# Patient Record
Sex: Male | Born: 1955 | Race: White | Hispanic: No | Smoking: Current every day smoker
Health system: Southern US, Community
[De-identification: ages and names within clinical notes are randomized; demographics above are authoritative.]

## PROBLEM LIST (undated history)

## (undated) DIAGNOSIS — R569 Unspecified convulsions: Secondary | ICD-10-CM

## (undated) DIAGNOSIS — E785 Hyperlipidemia, unspecified: Secondary | ICD-10-CM

## (undated) DIAGNOSIS — F1010 Alcohol abuse, uncomplicated: Secondary | ICD-10-CM

## (undated) DIAGNOSIS — T7840XA Allergy, unspecified, initial encounter: Secondary | ICD-10-CM

## (undated) DIAGNOSIS — M1990 Unspecified osteoarthritis, unspecified site: Secondary | ICD-10-CM

## (undated) HISTORY — DX: Hyperlipidemia, unspecified: E78.5

## (undated) HISTORY — DX: Allergy, unspecified, initial encounter: T78.40XA

## (undated) HISTORY — PX: JOINT REPLACEMENT: SHX530

## (undated) HISTORY — DX: Unspecified osteoarthritis, unspecified site: M19.90

## (undated) HISTORY — PX: OTHER SURGICAL HISTORY: SHX169

## (undated) HISTORY — PX: SMALL INTESTINE SURGERY: SHX150

---

## 2004-11-12 ENCOUNTER — Inpatient Hospital Stay: Payer: Self-pay | Admitting: Unknown Physician Specialty

## 2007-05-16 ENCOUNTER — Ambulatory Visit: Payer: Self-pay

## 2007-06-12 ENCOUNTER — Inpatient Hospital Stay: Payer: Self-pay | Admitting: Internal Medicine

## 2007-06-15 ENCOUNTER — Ambulatory Visit: Payer: Self-pay | Admitting: Oncology

## 2007-06-25 ENCOUNTER — Inpatient Hospital Stay: Payer: Self-pay | Admitting: Internal Medicine

## 2007-06-25 ENCOUNTER — Other Ambulatory Visit: Payer: Self-pay

## 2007-07-16 ENCOUNTER — Ambulatory Visit: Payer: Self-pay | Admitting: Oncology

## 2009-04-13 IMAGING — CR DG CHEST 1V PORT
1 series · 1 of 1 positions shown · non-contrast
Comparison: none

REASON FOR EXAM: fever, cirrhosis r/o infiltrate
COMMENTS:

PROCEDURE:     DXR - DXR PORTABLE CHEST SINGLE VIEW  - June 13, 2007  [DATE]
RESULT:     No acute cardiopulmonary disease is identified.

[view not recorded]
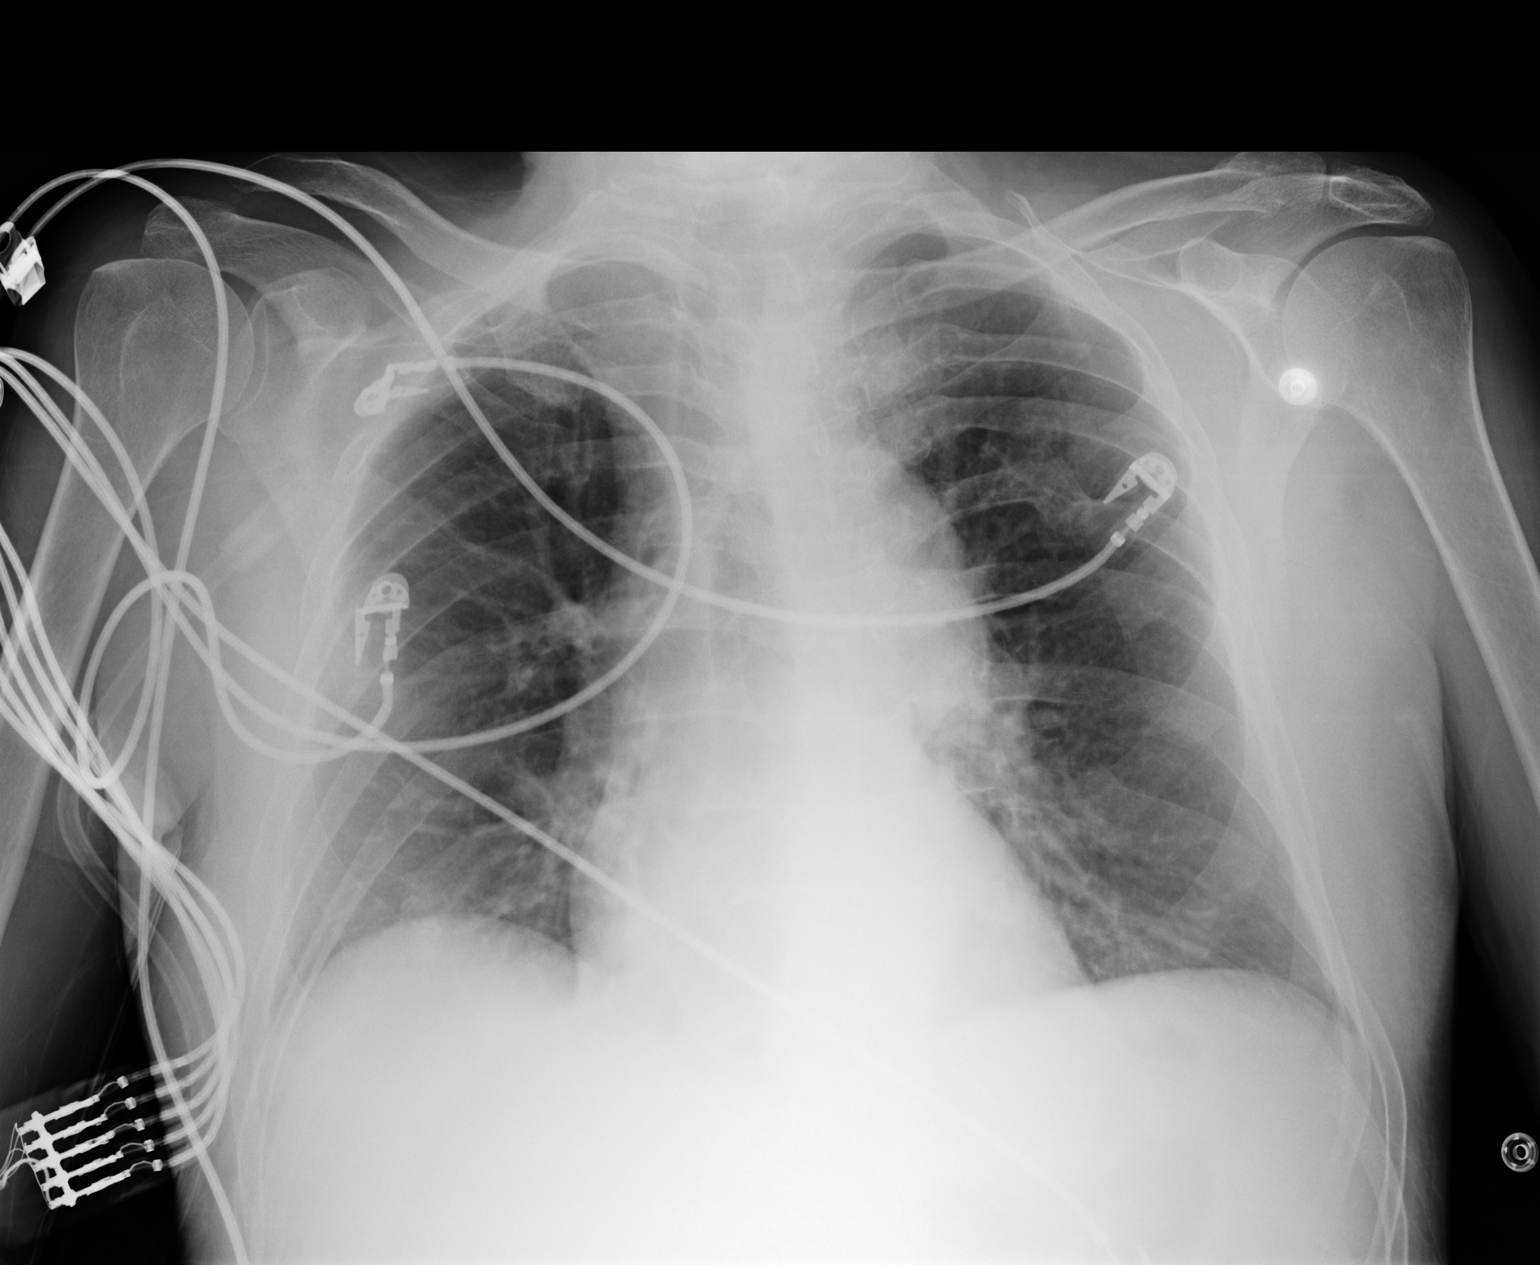

[1 of 1 positions shown; findings below may reference images not displayed]

IMPRESSION: No acute abnormality.

## 2010-04-16 ENCOUNTER — Inpatient Hospital Stay: Payer: Self-pay | Admitting: Surgery

## 2010-05-16 ENCOUNTER — Emergency Department: Payer: Self-pay | Admitting: Emergency Medicine

## 2011-12-23 LAB — COMPREHENSIVE METABOLIC PANEL
Albumin: 3.8 g/dL (ref 3.4–5.0)
Anion Gap: 13 (ref 7–16)
BUN: 6 mg/dL — ABNORMAL LOW (ref 7–18)
Bilirubin,Total: 0.6 mg/dL (ref 0.2–1.0)
Calcium, Total: 9.5 mg/dL (ref 8.5–10.1)
Glucose: 121 mg/dL — ABNORMAL HIGH (ref 65–99)
Osmolality: 278 (ref 275–301)
Potassium: 3.3 mmol/L — ABNORMAL LOW (ref 3.5–5.1)
SGOT(AST): 21 U/L (ref 15–37)
Sodium: 140 mmol/L (ref 136–145)

## 2011-12-23 LAB — URINALYSIS, COMPLETE
Bilirubin,UR: NEGATIVE
Cellular Cast: 4
Granular Cast: 6
Ketone: NEGATIVE
Protein: 100
RBC,UR: 82 /HPF (ref 0–5)
Squamous Epithelial: 1
WBC UR: 15 /HPF (ref 0–5)

## 2011-12-23 LAB — CBC WITH DIFFERENTIAL/PLATELET
Basophil #: 0 10*3/uL (ref 0.0–0.1)
Eosinophil #: 0.1 10*3/uL (ref 0.0–0.7)
MCH: 29.1 pg (ref 26.0–34.0)
MCHC: 32.1 g/dL (ref 32.0–36.0)
MCV: 91 fL (ref 80–100)
Monocyte #: 0.8 x10 3/mm (ref 0.2–1.0)
Monocyte %: 6 %
RBC: 4.44 10*6/uL (ref 4.40–5.90)
RDW: 13.9 % (ref 11.5–14.5)
WBC: 12.7 10*3/uL — ABNORMAL HIGH (ref 3.8–10.6)

## 2011-12-23 LAB — DRUG SCREEN, URINE
Barbiturates, Ur Screen: NEGATIVE (ref ?–200)
Opiate, Ur Screen: POSITIVE (ref ?–300)
Phencyclidine (PCP) Ur S: NEGATIVE (ref ?–25)
Tricyclic, Ur Screen: NEGATIVE (ref ?–1000)

## 2011-12-23 LAB — TROPONIN I: Troponin-I: <0.02 ng/mL

## 2011-12-24 ENCOUNTER — Inpatient Hospital Stay: Payer: Self-pay | Admitting: Internal Medicine

## 2011-12-24 LAB — ETHANOL
Ethanol %: <0.003 % (ref 0.000–0.080)
Ethanol: <3 mg/dL

## 2011-12-24 LAB — ACETAMINOPHEN LEVEL: Acetaminophen: <2 ug/mL

## 2011-12-24 LAB — T4, FREE: Free Thyroxine: 1.6 ng/dL — ABNORMAL HIGH (ref 0.76–1.46)

## 2011-12-24 LAB — CK: CK, Total: 88 U/L (ref 35–232)

## 2011-12-25 LAB — COMPREHENSIVE METABOLIC PANEL
Albumin: 2.9 g/dL — ABNORMAL LOW (ref 3.4–5.0)
Anion Gap: 12 (ref 7–16)
BUN: 5 mg/dL — ABNORMAL LOW (ref 7–18)
Bilirubin,Total: 0.7 mg/dL (ref 0.2–1.0)
Calcium, Total: 8 mg/dL — ABNORMAL LOW (ref 8.5–10.1)
Chloride: 105 mmol/L (ref 98–107)
Co2: 23 mmol/L (ref 21–32)
Creatinine: 0.96 mg/dL (ref 0.60–1.30)
EGFR (African American): >60
EGFR (Non-African Amer.): >60
Osmolality: 275 (ref 275–301)
Potassium: 3.8 mmol/L (ref 3.5–5.1)
SGOT(AST): 22 U/L (ref 15–37)
SGPT (ALT): 10 U/L — ABNORMAL LOW
Sodium: 140 mmol/L (ref 136–145)
Total Protein: 6 g/dL — ABNORMAL LOW (ref 6.4–8.2)

## 2011-12-25 LAB — MAGNESIUM: Magnesium: 1.4 mg/dL — ABNORMAL LOW

## 2011-12-25 LAB — CBC WITH DIFFERENTIAL/PLATELET
Basophil %: 0.3 %
Eosinophil #: 0.1 10*3/uL (ref 0.0–0.7)
Eosinophil %: 0.6 %
HCT: 33.3 % — ABNORMAL LOW (ref 40.0–52.0)
Lymphocyte %: 12.7 %
MCH: 29.8 pg (ref 26.0–34.0)
MCHC: 32.8 g/dL (ref 32.0–36.0)
MCV: 91 fL (ref 80–100)
Monocyte #: 0.5 x10 3/mm (ref 0.2–1.0)
Monocyte %: 6.7 %
Neutrophil %: 79.7 %
Platelet: 100 10*3/uL — ABNORMAL LOW (ref 150–440)
RBC: 3.67 10*6/uL — ABNORMAL LOW (ref 4.40–5.90)
RDW: 13.9 % (ref 11.5–14.5)
WBC: 7.9 10*3/uL (ref 3.8–10.6)

## 2011-12-29 LAB — CULTURE, BLOOD (SINGLE)

## 2012-01-23 DIAGNOSIS — S72009A Fracture of unspecified part of neck of unspecified femur, initial encounter for closed fracture: Secondary | ICD-10-CM | POA: Insufficient documentation

## 2012-01-23 DIAGNOSIS — F1011 Alcohol abuse, in remission: Secondary | ICD-10-CM | POA: Insufficient documentation

## 2012-03-18 DIAGNOSIS — G894 Chronic pain syndrome: Secondary | ICD-10-CM | POA: Insufficient documentation

## 2012-03-18 DIAGNOSIS — M1990 Unspecified osteoarthritis, unspecified site: Secondary | ICD-10-CM | POA: Insufficient documentation

## 2012-05-29 ENCOUNTER — Emergency Department: Payer: Self-pay | Admitting: Emergency Medicine

## 2013-08-18 DIAGNOSIS — N200 Calculus of kidney: Secondary | ICD-10-CM | POA: Insufficient documentation

## 2013-11-18 DIAGNOSIS — F32A Depression, unspecified: Secondary | ICD-10-CM | POA: Insufficient documentation

## 2013-11-18 DIAGNOSIS — F329 Major depressive disorder, single episode, unspecified: Secondary | ICD-10-CM | POA: Insufficient documentation
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
## 2014-11-06 NOTE — Consult Note (Signed)
Brief Consult Note: Diagnosis: delirium due to accidental medication overdose now resolved.   Patient was seen by consultant.   Consult note dictated.   Comments: Psychiatry: Patient seen. Chart reviewed. 59 year old man with a past history of alcohol abuse and opiate and benzo abuse who was brought in unconcious after a fall at home. Patient is now alert, oriented and normally interactive. No sign of delirium. He admits he took an unknown "muscle relaxer" from somebody but denies having overdosed on his pain meds. Denies any suicidal ideation at all or any suicidal intent. He can't explain why his oxycodone bottle is empty. He is clearly fixed on wanting to be "allowed" to be discharged and on convining me he is not suicidal even before I had brought it up. If he is abusing meds he has no insight he can express about it and no interest in discussing it as a problem. I do not think he is an acute suicide risk. I do think anyone who prescribes narcotics for him should do so very carefully. Education done about dangers of opiate abuse and mixing opiates and benzos. No further treatment at this time.  Electronic Signatures: Audery Amellapacs, John T (MD)  (Signed 13-Jun-13 13:00)  Authored: Brief Consult Note   Last Updated: 13-Jun-13 13:00 by Audery Amellapacs, John T (MD)

## 2014-12-27 ENCOUNTER — Emergency Department: Payer: Medicare Other

## 2014-12-27 ENCOUNTER — Encounter: Payer: Self-pay | Admitting: Emergency Medicine

## 2014-12-27 ENCOUNTER — Emergency Department
Admission: EM | Admit: 2014-12-27 | Discharge: 2014-12-27 | Disposition: A | Discharge status: Home / Self Care | Payer: Medicare Other | Attending: Emergency Medicine | Admitting: Emergency Medicine

## 2014-12-27 DIAGNOSIS — M25522 Pain in left elbow: Secondary | ICD-10-CM | POA: Diagnosis present

## 2014-12-27 DIAGNOSIS — M7022 Olecranon bursitis, left elbow: Secondary | ICD-10-CM | POA: Diagnosis not present

## 2014-12-27 DIAGNOSIS — Z720 Tobacco use: Secondary | ICD-10-CM | POA: Insufficient documentation

## 2014-12-27 DIAGNOSIS — Y9389 Activity, other specified: Secondary | ICD-10-CM | POA: Insufficient documentation

## 2014-12-27 MED ORDER — IBUPROFEN 800 MG PO TABS: 800.0000 mg | ORAL_TABLET | Freq: Three times a day (TID) | ORAL | Status: DC | PRN

## 2014-12-27 MED ORDER — OXYCODONE HCL 10 MG PO TABS: 10.0000 mg | ORAL_TABLET | Freq: Four times a day (QID) | ORAL | Status: DC | PRN

## 2014-12-27 MED ORDER — PREDNISONE 10 MG PO TABS: ORAL_TABLET | ORAL | Status: DC
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
## 2014-12-27 NOTE — Discharge Instructions (Signed)
Bursitis °Bursitis is a swelling and soreness (inflammation) of a fluid-filled sac (bursa) that overlies and protects a joint. It can be caused by injury, overuse of the joint, arthritis or infection. The joints most likely to be affected are the elbows, shoulders, hips and knees. °HOME CARE INSTRUCTIONS  °· Apply ice to the affected area for 15-20 minutes each hour while awake for 2 days. Put the ice in a plastic bag and place a towel between the bag of ice and your skin. °· Rest the injured joint as much as possible, but continue to put the joint through a full range of motion, 4 times per day. (The shoulder joint especially becomes rapidly "frozen" if not used.) When the pain lessens, begin normal slow movements and usual activities. °· Only take over-the-counter or prescription medicines for pain, discomfort or fever as directed by your caregiver. °· Your caregiver may recommend draining the bursa and injecting medicine into the bursa. This may help the healing process. °· Follow all instructions for follow-up with your caregiver. This includes any orthopedic referrals, physical therapy and rehabilitation. Any delay in obtaining necessary care could result in a delay or failure of the bursitis to heal and chronic pain. °SEEK IMMEDIATE MEDICAL CARE IF:  °· Your pain increases even during treatment. °· You develop an oral temperature above 102° F (38.9° C) and have heat and inflammation over the involved bursa. °MAKE SURE YOU:  °· Understand these instructions. °· Will watch your condition. °· Will get help right away if you are not doing well or get worse. °Document Released: 06/28/2000 Document Revised: 09/23/2011 Document Reviewed: 09/20/2013 °[employer]® Patient Information ©2015 [employer]. This information is not intended to replace advice given to you by your health care provider. Make sure you discuss any questions you have with your health care provider. ° °
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
## 2015-01-26 DIAGNOSIS — F5101 Primary insomnia: Secondary | ICD-10-CM | POA: Insufficient documentation

## 2015-08-25 ENCOUNTER — Emergency Department
Admission: EM | Admit: 2015-08-25 | Discharge: 2015-08-25 | Disposition: A | Discharge status: Home / Self Care | Payer: Medicare Other | Attending: Emergency Medicine | Admitting: Emergency Medicine

## 2015-08-25 ENCOUNTER — Emergency Department: Payer: Medicare Other

## 2015-08-25 ENCOUNTER — Encounter: Payer: Self-pay | Admitting: Emergency Medicine

## 2015-08-25 DIAGNOSIS — Y9289 Other specified places as the place of occurrence of the external cause: Secondary | ICD-10-CM | POA: Insufficient documentation

## 2015-08-25 DIAGNOSIS — S2231XA Fracture of one rib, right side, initial encounter for closed fracture: Secondary | ICD-10-CM

## 2015-08-25 DIAGNOSIS — F17200 Nicotine dependence, unspecified, uncomplicated: Secondary | ICD-10-CM | POA: Insufficient documentation

## 2015-08-25 DIAGNOSIS — Y998 Other external cause status: Secondary | ICD-10-CM | POA: Insufficient documentation

## 2015-08-25 DIAGNOSIS — S29001A Unspecified injury of muscle and tendon of front wall of thorax, initial encounter: Secondary | ICD-10-CM | POA: Diagnosis present

## 2015-08-25 DIAGNOSIS — S2241XA Multiple fractures of ribs, right side, initial encounter for closed fracture: Secondary | ICD-10-CM | POA: Diagnosis not present

## 2015-08-25 DIAGNOSIS — Y9389 Activity, other specified: Secondary | ICD-10-CM | POA: Diagnosis not present

## 2015-08-25 DIAGNOSIS — Z880 Allergy status to penicillin: Secondary | ICD-10-CM | POA: Insufficient documentation

## 2015-08-25 DIAGNOSIS — W108XXA Fall (on) (from) other stairs and steps, initial encounter: Secondary | ICD-10-CM | POA: Diagnosis not present

## 2015-08-25 DIAGNOSIS — S4991XA Unspecified injury of right shoulder and upper arm, initial encounter: Secondary | ICD-10-CM | POA: Diagnosis not present

## 2015-08-25 MED ORDER — OXYCODONE-ACETAMINOPHEN 5-325 MG PO TABS: 1.0000 | ORAL_TABLET | Freq: Four times a day (QID) | ORAL | Status: DC | PRN

## 2015-08-25 MED ORDER — NAPROXEN 500 MG PO TABS: 500.0000 mg | ORAL_TABLET | Freq: Two times a day (BID) | ORAL | Status: DC

## 2015-08-25 NOTE — ED Provider Notes (Signed)
[facility] Emergency Department Provider Note  ____________________________________________  Time seen: Approximately 1:28 PM  I have reviewed the triage vital signs and the nursing notes.   HISTORY  Chief Complaint Fall    HPI Patrick Patton is a 60 y.o. male who presents to emergency department status post fall 3 days prior. Patient states that he is walking down a flight of stairs when he had a mechanical fall landing on his right shoulder, arm, ribs. Patient states that he initially did not want to come to the hospital thinking "I just bruised myself." Patient states thatthe pain in his shoulder and ribs has not improved. Patient is concerned that "a maintenance and damaged in my ribs." Patient denies any shortness of breath. He denies hitting his head or losing consciousness at the time of incident.   History reviewed. No pertinent past medical history.  There are no active problems to display for this patient.   Past Surgical History  Procedure Laterality Date  . Pin in arm      Current Outpatient Rx  Name  Route  Sig  Dispense  Refill  . ibuprofen (ADVIL,MOTRIN) 800 MG tablet   Oral   Take 1 tablet (800 mg total) by mouth every 8 (eight) hours as needed.   30 tablet   0   . naproxen (NAPROSYN) 500 MG tablet   Oral   Take 1 tablet (500 mg total) by mouth 2 (two) times daily with a meal.   60 tablet   0   . Oxycodone HCl 10 MG TABS   Oral   Take 1 tablet (10 mg total) by mouth 4 (four) times daily as needed.   12 tablet   0   . oxyCODONE-acetaminophen (ROXICET) 5-325 MG tablet   Oral   Take 1 tablet by mouth every 6 (six) hours as needed for severe pain.   20 tablet   0   . predniSONE (DELTASONE) 10 MG tablet      Take 5 tablets daily for 5 days   25 tablet   0     Allergies Penicillins and Codeine  History reviewed. No pertinent family history.  Social History Social History  Substance Use Topics  . Smoking  status: Current Every Day Smoker  . Smokeless tobacco: None  . Alcohol Use: No     Review of Systems  Constitutional: No fever/chills Cardiovascular: no chest pain. Respiratory: no cough. No SOB. Gastrointestinal: No abdominal pain.  No nausea, no vomiting.   Musculoskeletal: Negative for back pain. Patient reports pain to the right shoulder and pain to the right rib cage. Skin: Negative for rash. Neurological: Negative for headaches, focal weakness or numbness. 10-point ROS otherwise negative.  ____________________________________________   PHYSICAL EXAM:  VITAL SIGNS: ED Triage Vitals  Enc Vitals Group     BP 08/25/15 1149 116/67 mmHg     Pulse Rate 08/25/15 1149 65     Resp 08/25/15 1149 18     Temp 08/25/15 1149 98.6 F (37 C)     Temp Source 08/25/15 1149 Oral     SpO2 08/25/15 1149 96 %     Weight 08/25/15 1149 125 lb (56.7 kg)     Height 08/25/15 1149  (1.676 m)     Head Cir --      Peak Flow --      Pain Score 08/25/15 1145 7     Pain Loc --      Pain Edu? --  Excl. in GC? --      Constitutional: Alert and oriented. Well appearing and in no acute distress. Eyes: Conjunctivae are normal. PERRL. EOMI. Head: Atraumatic. Neck: No stridor.  No cervical spine tenderness to palpation. Cardiovascular: Normal rate, regular rhythm. Normal S1 and S2.  Good peripheral circulation. Respiratory: Normal respiratory effort without tachypnea or retractions. Lungs CTAB. No absent or decreased breath sounds. Good air entry into the bases. Gastrointestinal: Soft and nontender. No distention. No CVA tenderness. Musculoskeletal: No visible abnormality to ribs upon inspection. No flow segments or paradoxical chest wall movement. Patient is tender to palpation over the third, fourth, fifth, sixth ribs in the lateral rib cage. No palpable abnormality. Neurologic:  Normal speech and language. No gross focal neurologic deficits are appreciated.  Skin:  Skin is warm, dry and  intact. No rash noted. Psychiatric: Mood and affect are normal. Speech and behavior are normal. Patient exhibits appropriate insight and judgement.   ____________________________________________   LABS (all labs ordered are listed, but only abnormal results are displayed)  Labs Reviewed - No data to display ____________________________________________  EKG   ____________________________________________  RADIOLOGY Festus Barren Cuthriell, personally viewed and evaluated these images (plain radiographs) as part of my medical decision making, as well as reviewing the written report by the radiologist.  Dg Ribs Unilateral W/chest Right  08/25/2015  CLINICAL DATA:  Status post fall 3 days ago. Right chest pain. Initial encounter. EXAM: RIGHT RIBS AND CHEST - 3+ VIEW COMPARISON:  Single view of the chest 12/24/2011. FINDINGS: The chest is hyperexpanded with attenuation of the pulmonary vasculature. No focal airspace disease is identified. There is no pneumothorax or pleural effusion. Acute right sixth and seventh rib fractures are identified. No other acute fracture is seen. There is a remote left 6 rib fracture, unchanged. IMPRESSION: Acute right sixth and seventh rib fractures. Negative for pneumothorax. Emphysema. Electronically Signed   By: Drusilla Kanner M.D.   On: 08/25/2015 14:02   Dg Shoulder Right  08/25/2015  CLINICAL DATA:  Pain following fall down steps 3 days prior EXAM: RIGHT SHOULDER - 2+ VIEW COMPARISON:  None. FINDINGS: Oblique, axillary, and Y scapular images were obtained. There appears to be a degree of acromioclavicular separation. There is no frank dislocation. No shoulder region fracture. No erosive change. In the chest region, there are mildly displaced fractures of the posterior right fifth and sixth ribs. No pneumothorax is evident in the visualized portions of the right hemithorax. IMPRESSION: Fractures of the posterior right fifth and sixth ribs. Advise chest  radiograph to further assess. No shoulder region fractures apparent. There is suspected right acromioclavicular separation. No frank dislocation evident. Electronically Signed   By: Bretta Bang III M.D.   On: 08/25/2015 13:08    ____________________________________________    PROCEDURES  Procedure(s) performed:       Medications - No data to display   ____________________________________________   INITIAL IMPRESSION / ASSESSMENT AND PLAN / ED COURSE  Pertinent labs & imaging results that were available during my care of the patient were reviewed by me and considered in my medical decision making (see chart for details).  Patient's diagnosis is consistent with right fifth and sixth rib fractures. There is no underlying pneumothorax. Exam is reassuring.. Patient will be discharged home with prescriptions for pain medication. He is to follow-up with orthopedic surgeon for further evaluation and treatment as needed.. Patient is given ED precautions to return to the ED for any worsening or new symptoms.  ____________________________________________  FINAL CLINICAL IMPRESSION(S) / ED DIAGNOSES  Final diagnoses:  Right rib fracture, closed, initial encounter      NEW MEDICATIONS STARTED DURING THIS VISIT:  New Prescriptions   NAPROXEN (NAPROSYN) 500 MG TABLET    Take 1 tablet (500 mg total) by mouth 2 (two) times daily with a meal.   OXYCODONE-ACETAMINOPHEN (ROXICET) 5-325 MG TABLET    Take 1 tablet by mouth every 6 (six) hours as needed for severe pain.        Delorise Royals Cuthriell, PA-C 08/25/15 1412  Sharyn Creamer, MD 08/25/15 [phone]

## 2015-08-25 NOTE — Discharge Instructions (Signed)
Rib Fracture °A rib fracture is a break or crack in one of the bones of the ribs. The ribs are a group of long, curved bones that wrap around your chest and attach to your spine. They protect your lungs and other organs in the chest cavity. A broken or cracked rib is often painful, but most do not cause other problems. Most rib fractures heal on their own over time. However, rib fractures can be more serious if multiple ribs are broken or if broken ribs move out of place and push against other structures. °CAUSES  °· A direct blow to the chest. For example, this could happen during contact sports, a car accident, or a fall against a hard object. °· Repetitive movements with high force, such as pitching a baseball or having severe coughing spells. °SYMPTOMS  °· Pain when you breathe in or cough. °· Pain when someone presses on the injured area. °DIAGNOSIS  °Your caregiver will perform a physical exam. Various imaging tests may be ordered to confirm the diagnosis and to look for related injuries. These tests may include a chest X-ray, computed tomography (CT), magnetic resonance imaging (MRI), or a bone scan. °TREATMENT  °Rib fractures usually heal on their own in 1-3 months. The longer healing period is often associated with a continued cough or other aggravating activities. During the healing period, pain control is very important. Medication is usually given to control pain. Hospitalization or surgery may be needed for more severe injuries, such as those in which multiple ribs are broken or the ribs have moved out of place.  °HOME CARE INSTRUCTIONS  °· Avoid strenuous activity and any activities or movements that cause pain. Be careful during activities and avoid bumping the injured rib. °· Gradually increase activity as directed by your caregiver. °· Only take over-the-counter or prescription medications as directed by your caregiver. Do not take other medications without asking your caregiver first. °· Apply ice  to the injured area for the first 1-2 days after you have been treated or as directed by your caregiver. Applying ice helps to reduce inflammation and pain. °¨ Put ice in a plastic bag. °¨ Place a towel between your skin and the bag.   °¨ Leave the ice on for 15-20 minutes at a time, every 2 hours while you are awake. °· Perform deep breathing as directed by your caregiver. This will help prevent pneumonia, which is a common complication of a broken rib. Your caregiver may instruct you to: °¨ Take deep breaths several times a day. °¨ Try to cough several times a day, holding a pillow against the injured area. °¨ Use a device called an incentive spirometer to practice deep breathing several times a day. °· Drink enough fluids to keep your urine clear or pale yellow. This will help you avoid constipation.   °· Do not wear a rib belt or binder. These restrict breathing, which can lead to pneumonia.   °SEEK IMMEDIATE MEDICAL CARE IF:  °· You have a fever.   °· You have difficulty breathing or shortness of breath.   °· You develop a continual cough, or you cough up thick or bloody sputum. °· You feel sick to your stomach (nausea), throw up (vomit), or have abdominal pain.   °· You have worsening pain not controlled with medications.   °MAKE SURE YOU: °· Understand these instructions. °· Will watch your condition. °· Will get help right away if you are not doing well or get worse. °  °This information is not intended to   replace advice given to you by your health care provider. Make sure you discuss any questions you have with your health care provider. °  °Document Released: 07/01/2005 Document Revised: 03/03/2013 Document Reviewed: 09/02/2012 °Elsevier Interactive Patient Education ©2016 [employer] ° °Blunt Chest Trauma °Blunt chest trauma is an injury caused by a blow to the chest. These chest injuries can be very painful. Blunt chest trauma often results in bruised or broken (fractured) ribs. Most cases of bruised  and fractured ribs from blunt chest traumas get better after 1 to 3 weeks of rest and pain medicine. Often, the soft tissue in the chest wall is also injured, causing pain and bruising. Internal organs, such as the heart and lungs, may also be injured. Blunt chest trauma can lead to serious medical problems. This injury requires immediate medical care. °CAUSES  °· Motor vehicle collisions. °· Falls. °· Physical violence. °· Sports injuries. °SYMPTOMS  °· Chest pain. The pain may be worse when you move or breathe deeply. °· Shortness of breath. °· Lightheadedness. °· Bruising. °· Tenderness. °· Swelling. °DIAGNOSIS  °Your caregiver will do a physical exam. X-rays may be taken to look for fractures. However, minor rib fractures may not show up on X-rays until a few days after the injury. If a more serious injury is suspected, further imaging tests may be done. This may include ultrasounds, computed tomography (CT) scans, or magnetic resonance imaging (MRI). °TREATMENT  °Treatment depends on the severity of your injury. Your caregiver may prescribe pain medicines and deep breathing exercises. °HOME CARE INSTRUCTIONS °· Limit your activities until you can move around without much pain. °· Do not do any strenuous work until your injury is healed. °· Put ice on the injured area. °¨ Put ice in a plastic bag. °¨ Place a towel between your skin and the bag. °¨ Leave the ice on for 15-20 minutes, 03-04 times a day. °· You may wear a rib belt as directed by your caregiver to reduce pain. °· Practice deep breathing as directed by your caregiver to keep your lungs clear. °· Only take over-the-counter or prescription medicines for pain, fever, or discomfort as directed by your caregiver. °SEEK IMMEDIATE MEDICAL CARE IF:  °· You have increasing pain or shortness of breath. °· You cough up blood. °· You have nausea, vomiting, or abdominal pain. °· You have a fever. °· You feel dizzy, weak, or you faint. °MAKE SURE  YOU: °· Understand these instructions. °· Will watch your condition. °· Will get help right away if you are not doing well or get worse. °  °This information is not intended to replace advice given to you by your health care provider. Make sure you discuss any questions you have with your health care provider. °  °Document Released: 08/08/2004 Document Revised: 07/22/2014 Document Reviewed: 12/28/2014 °Elsevier Interactive Patient Education ©2016 [employer] ° °

## 2015-08-25 NOTE — ED Notes (Signed)
Reports fell 3 days ago, pain to right shoulder and elbow.  +PMS to arm

## 2015-09-26 DIAGNOSIS — N183 Chronic kidney disease, stage 3 unspecified: Secondary | ICD-10-CM | POA: Insufficient documentation

## 2015-09-26 DIAGNOSIS — F1910 Other psychoactive substance abuse, uncomplicated: Secondary | ICD-10-CM | POA: Insufficient documentation

## 2015-09-26 DIAGNOSIS — S22009A Unspecified fracture of unspecified thoracic vertebra, initial encounter for closed fracture: Secondary | ICD-10-CM | POA: Insufficient documentation

## 2015-10-02 DIAGNOSIS — F17200 Nicotine dependence, unspecified, uncomplicated: Secondary | ICD-10-CM | POA: Insufficient documentation

## 2015-10-09 ENCOUNTER — Ambulatory Visit: Payer: Medicare Other | Admitting: Pain Medicine

## 2016-02-29 DIAGNOSIS — F419 Anxiety disorder, unspecified: Secondary | ICD-10-CM | POA: Insufficient documentation

## 2016-05-03 ENCOUNTER — Emergency Department
Admission: EM | Admit: 2016-05-03 | Discharge: 2016-05-03 | Disposition: A | Discharge status: Home / Self Care | Payer: Medicare Other | Attending: Student in an Organized Health Care Education/Training Program | Admitting: Student in an Organized Health Care Education/Training Program

## 2016-05-03 ENCOUNTER — Encounter: Payer: Self-pay | Admitting: Intensive Care

## 2016-05-03 ENCOUNTER — Emergency Department: Payer: Medicare Other

## 2016-05-03 DIAGNOSIS — G8929 Other chronic pain: Secondary | ICD-10-CM | POA: Insufficient documentation

## 2016-05-03 DIAGNOSIS — R569 Unspecified convulsions: Secondary | ICD-10-CM

## 2016-05-03 DIAGNOSIS — G40909 Epilepsy, unspecified, not intractable, without status epilepticus: Secondary | ICD-10-CM | POA: Insufficient documentation

## 2016-05-03 DIAGNOSIS — F17210 Nicotine dependence, cigarettes, uncomplicated: Secondary | ICD-10-CM | POA: Insufficient documentation

## 2016-05-03 DIAGNOSIS — E860 Dehydration: Secondary | ICD-10-CM | POA: Diagnosis not present

## 2016-05-03 DIAGNOSIS — Z79899 Other long term (current) drug therapy: Secondary | ICD-10-CM | POA: Insufficient documentation

## 2016-05-03 DIAGNOSIS — M25551 Pain in right hip: Secondary | ICD-10-CM | POA: Insufficient documentation

## 2016-05-03 HISTORY — DX: Unspecified convulsions: R56.9

## 2016-05-03 LAB — CBC WITH DIFFERENTIAL/PLATELET
BASOS ABS: 0.1 10*3/uL (ref 0–0.1)
Basophils Relative: 1 %
EOS PCT: 1 %
Eosinophils Absolute: 0.1 10*3/uL (ref 0–0.7)
HCT: 35.5 % — ABNORMAL LOW (ref 40.0–52.0)
Hemoglobin: 12.1 g/dL — ABNORMAL LOW (ref 13.0–18.0)
LYMPHS PCT: 14 %
Lymphs Abs: 0.9 10*3/uL — ABNORMAL LOW (ref 1.0–3.6)
MCH: 29 pg (ref 26.0–34.0)
MCHC: 33.9 g/dL (ref 32.0–36.0)
MCV: 85.6 fL (ref 80.0–100.0)
MONO ABS: 0.6 10*3/uL (ref 0.2–1.0)
MONOS PCT: 9 %
Neutro Abs: 5.1 10*3/uL (ref 1.4–6.5)
Neutrophils Relative %: 75 %
PLATELETS: 208 10*3/uL (ref 150–440)
RBC: 4.15 MIL/uL — ABNORMAL LOW (ref 4.40–5.90)
RDW: 15 % — AB (ref 11.5–14.5)
WBC: 6.8 10*3/uL (ref 3.8–10.6)

## 2016-05-03 LAB — COMPREHENSIVE METABOLIC PANEL
ALBUMIN: 3.9 g/dL (ref 3.5–5.0)
ALK PHOS: 86 U/L (ref 38–126)
ALT: 9 U/L — AB (ref 17–63)
AST: 20 U/L (ref 15–41)
Anion gap: 8 (ref 5–15)
BUN: <5 mg/dL — ABNORMAL LOW (ref 6–20)
CALCIUM: 8.5 mg/dL — AB (ref 8.9–10.3)
CHLORIDE: 108 mmol/L (ref 101–111)
CO2: 20 mmol/L — ABNORMAL LOW (ref 22–32)
CREATININE: 1.89 mg/dL — AB (ref 0.61–1.24)
GFR calc Af Amer: 43 mL/min — ABNORMAL LOW (ref >60)
GFR calc non Af Amer: 37 mL/min — ABNORMAL LOW (ref >60)
GLUCOSE: 97 mg/dL (ref 65–99)
Potassium: 2.5 mmol/L — CL (ref 3.5–5.1)
SODIUM: 136 mmol/L (ref 135–145)
Total Bilirubin: 1.1 mg/dL (ref 0.3–1.2)
Total Protein: 7 g/dL (ref 6.5–8.1)

## 2016-05-03 LAB — URINALYSIS COMPLETE WITH MICROSCOPIC (ARMC ONLY)
BILIRUBIN URINE: NEGATIVE
Bacteria, UA: NONE SEEN
GLUCOSE, UA: NEGATIVE mg/dL
KETONES UR: NEGATIVE mg/dL
LEUKOCYTES UA: NEGATIVE
Nitrite: NEGATIVE
Protein, ur: NEGATIVE mg/dL
SQUAMOUS EPITHELIAL / LPF: NONE SEEN
Specific Gravity, Urine: 1.003 — ABNORMAL LOW (ref 1.005–1.030)
pH: 6 (ref 5.0–8.0)

## 2016-05-03 LAB — TROPONIN I

## 2016-05-03 MED ORDER — POTASSIUM CHLORIDE CRYS ER 20 MEQ PO TBCR
40.0000 meq | EXTENDED_RELEASE_TABLET | Freq: Once | ORAL | Status: AC
  Administered 2016-05-03: 40 meq via ORAL
  Filled 2016-05-03: qty 2

## 2016-05-03 MED ORDER — SODIUM CHLORIDE 0.9 % IV BOLUS (SEPSIS)
1000.0000 mL | Freq: Once | INTRAVENOUS | Status: AC
  Administered 2016-05-03: 1000 mL via INTRAVENOUS

## 2016-05-03 MED ORDER — LORAZEPAM 1 MG PO TABS
1.0000 mg | ORAL_TABLET | Freq: Once | ORAL | Status: AC
  Administered 2016-05-03: 1 mg via ORAL
  Filled 2016-05-03: qty 1
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
## 2016-05-03 NOTE — ED Notes (Signed)
MD at bedside. 
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
## 2016-05-31 ENCOUNTER — Emergency Department: Payer: Medicare Other

## 2016-05-31 ENCOUNTER — Inpatient Hospital Stay
Admission: EM | Admit: 2016-05-31 | Discharge: 2016-06-04 | DRG: 682 | Disposition: A | Discharge status: Skilled Nursing Facility | Payer: Medicare Other | Attending: Internal Medicine | Admitting: Internal Medicine

## 2016-05-31 ENCOUNTER — Encounter: Payer: Self-pay | Admitting: Emergency Medicine

## 2016-05-31 DIAGNOSIS — F419 Anxiety disorder, unspecified: Secondary | ICD-10-CM | POA: Diagnosis not present

## 2016-05-31 DIAGNOSIS — Z885 Allergy status to narcotic agent status: Secondary | ICD-10-CM | POA: Diagnosis not present

## 2016-05-31 DIAGNOSIS — X58XXXA Exposure to other specified factors, initial encounter: Secondary | ICD-10-CM | POA: Diagnosis present

## 2016-05-31 DIAGNOSIS — T148XXA Other injury of unspecified body region, initial encounter: Secondary | ICD-10-CM | POA: Diagnosis present

## 2016-05-31 DIAGNOSIS — E876 Hypokalemia: Secondary | ICD-10-CM

## 2016-05-31 DIAGNOSIS — R1310 Dysphagia, unspecified: Secondary | ICD-10-CM

## 2016-05-31 DIAGNOSIS — F17210 Nicotine dependence, cigarettes, uncomplicated: Secondary | ICD-10-CM | POA: Diagnosis present

## 2016-05-31 DIAGNOSIS — I214 Non-ST elevation (NSTEMI) myocardial infarction: Secondary | ICD-10-CM | POA: Diagnosis present

## 2016-05-31 DIAGNOSIS — R059 Cough, unspecified: Secondary | ICD-10-CM

## 2016-05-31 DIAGNOSIS — N170 Acute kidney failure with tubular necrosis: Secondary | ICD-10-CM | POA: Diagnosis present

## 2016-05-31 DIAGNOSIS — F17220 Nicotine dependence, chewing tobacco, uncomplicated: Secondary | ICD-10-CM | POA: Diagnosis present

## 2016-05-31 DIAGNOSIS — Y95 Nosocomial condition: Secondary | ICD-10-CM | POA: Diagnosis not present

## 2016-05-31 DIAGNOSIS — N19 Unspecified kidney failure: Secondary | ICD-10-CM

## 2016-05-31 DIAGNOSIS — Z79899 Other long term (current) drug therapy: Secondary | ICD-10-CM | POA: Diagnosis not present

## 2016-05-31 DIAGNOSIS — M6281 Muscle weakness (generalized): Secondary | ICD-10-CM

## 2016-05-31 DIAGNOSIS — Z880 Allergy status to penicillin: Secondary | ICD-10-CM | POA: Diagnosis not present

## 2016-05-31 DIAGNOSIS — R269 Unspecified abnormalities of gait and mobility: Secondary | ICD-10-CM

## 2016-05-31 DIAGNOSIS — R4182 Altered mental status, unspecified: Secondary | ICD-10-CM

## 2016-05-31 DIAGNOSIS — E860 Dehydration: Secondary | ICD-10-CM | POA: Diagnosis not present

## 2016-05-31 DIAGNOSIS — J189 Pneumonia, unspecified organism: Secondary | ICD-10-CM | POA: Diagnosis not present

## 2016-05-31 DIAGNOSIS — R05 Cough: Secondary | ICD-10-CM

## 2016-05-31 LAB — TROPONIN I
Troponin I: 2.55 ng/mL (ref <0.03)
Troponin I: 1.6 ng/mL (ref <0.03)

## 2016-05-31 LAB — CBC WITH DIFFERENTIAL/PLATELET
BASOS ABS: 0 10*3/uL (ref 0–0.1)
BASOS PCT: 0 %
EOS ABS: 0 10*3/uL (ref 0–0.7)
Eosinophils Relative: 0 %
HEMATOCRIT: 39.7 % — AB (ref 40.0–52.0)
HEMOGLOBIN: 13.1 g/dL (ref 13.0–18.0)
Lymphocytes Relative: 6 %
Lymphs Abs: 1 10*3/uL (ref 1.0–3.6)
MCH: 28 pg (ref 26.0–34.0)
MCHC: 33.1 g/dL (ref 32.0–36.0)
MCV: 84.6 fL (ref 80.0–100.0)
Monocytes Absolute: 1.1 10*3/uL — ABNORMAL HIGH (ref 0.2–1.0)
Monocytes Relative: 6 %
NEUTROS ABS: 15.5 10*3/uL — AB (ref 1.4–6.5)
NEUTROS PCT: 88 %
Platelets: 190 10*3/uL (ref 150–440)
RBC: 4.69 MIL/uL (ref 4.40–5.90)
RDW: 15.7 % — ABNORMAL HIGH (ref 11.5–14.5)
WBC: 17.7 10*3/uL — AB (ref 3.8–10.6)

## 2016-05-31 LAB — BLOOD GAS, VENOUS
ACID-BASE DEFICIT: 4.5 mmol/L — AB (ref 0.0–2.0)
Bicarbonate: 21.1 mmol/L (ref 20.0–28.0)
PATIENT TEMPERATURE: 37
PH VEN: 7.33 (ref 7.250–7.430)
pCO2, Ven: 40 mmHg — ABNORMAL LOW (ref 44.0–60.0)

## 2016-05-31 LAB — COMPREHENSIVE METABOLIC PANEL
ALBUMIN: 4.5 g/dL (ref 3.5–5.0)
ALK PHOS: 80 U/L (ref 38–126)
ALT: 37 U/L (ref 17–63)
ANION GAP: 13 (ref 5–15)
AST: 133 U/L — AB (ref 15–41)
BILIRUBIN TOTAL: 1.2 mg/dL (ref 0.3–1.2)
BUN: 26 mg/dL — AB (ref 6–20)
CALCIUM: 9.3 mg/dL (ref 8.9–10.3)
CO2: 21 mmol/L — AB (ref 22–32)
CREATININE: 2.4 mg/dL — AB (ref 0.61–1.24)
Chloride: 107 mmol/L (ref 101–111)
GFR calc Af Amer: 32 mL/min — ABNORMAL LOW (ref >60)
GFR calc non Af Amer: 28 mL/min — ABNORMAL LOW (ref >60)
GLUCOSE: 89 mg/dL (ref 65–99)
Potassium: 2.3 mmol/L — CL (ref 3.5–5.1)
SODIUM: 141 mmol/L (ref 135–145)
Total Protein: 7.7 g/dL (ref 6.5–8.1)

## 2016-05-31 LAB — PROTIME-INR
INR: 1.06
PROTHROMBIN TIME: 13.8 s (ref 11.4–15.2)

## 2016-05-31 LAB — MAGNESIUM: Magnesium: 2.2 mg/dL (ref 1.7–2.4)

## 2016-05-31 LAB — APTT: aPTT: 32 seconds (ref 24–36)

## 2016-05-31 LAB — ETHANOL

## 2016-05-31 LAB — LACTIC ACID, PLASMA: Lactic Acid, Venous: 1.2 mmol/L (ref 0.5–1.9)

## 2016-05-31 LAB — ACETAMINOPHEN LEVEL

## 2016-05-31 LAB — SALICYLATE LEVEL: Salicylate Lvl: <7 mg/dL (ref 2.8–30.0)

## 2016-05-31 LAB — CK: Total CK: 1815 U/L — ABNORMAL HIGH (ref 49–397)

## 2016-05-31 MED ORDER — SODIUM CHLORIDE 0.9 % IV SOLN
1000.0000 mL | Freq: Once | INTRAVENOUS | Status: AC
  Administered 2016-05-31: 1000 mL via INTRAVENOUS

## 2016-05-31 MED ORDER — ASPIRIN 81 MG PO CHEW
324.0000 mg | CHEWABLE_TABLET | Freq: Once | ORAL | Status: AC
  Administered 2016-05-31: 324 mg via ORAL
  Filled 2016-05-31: qty 4

## 2016-05-31 MED ORDER — HEPARIN SODIUM (PORCINE) 5000 UNIT/ML IJ SOLN: 60.0000 [IU]/kg | Freq: Once | INTRAMUSCULAR | Status: DC

## 2016-05-31 MED ORDER — VITAMIN B-1 100 MG PO TABS
100.0000 mg | ORAL_TABLET | Freq: Every day | ORAL | Status: DC
  Administered 2016-05-31 – 2016-06-04 (×5): 100 mg via ORAL
  Filled 2016-05-31 (×5): qty 1

## 2016-05-31 MED ORDER — POTASSIUM CHLORIDE 20 MEQ PO PACK
20.0000 meq | PACK | Freq: Two times a day (BID) | ORAL | Status: AC
  Administered 2016-05-31 – 2016-06-01 (×2): 20 meq via ORAL
  Filled 2016-05-31 (×2): qty 1

## 2016-05-31 MED ORDER — HEPARIN (PORCINE) IN NACL 100-0.45 UNIT/ML-% IJ SOLN
1150.0000 [IU]/h | INTRAMUSCULAR | Status: DC
  Administered 2016-05-31: 650 [IU]/h via INTRAVENOUS
  Administered 2016-06-01: 850 [IU]/h via INTRAVENOUS
  Administered 2016-06-02: 1150 [IU]/h via INTRAVENOUS
  Filled 2016-05-31 (×3): qty 250

## 2016-05-31 MED ORDER — ADULT MULTIVITAMIN W/MINERALS CH
1.0000 | ORAL_TABLET | Freq: Every day | ORAL | Status: DC
  Administered 2016-05-31 – 2016-06-04 (×5): 1 via ORAL
  Filled 2016-05-31 (×5): qty 1

## 2016-05-31 MED ORDER — POTASSIUM CHLORIDE IN NACL 20-0.9 MEQ/L-% IV SOLN
Freq: Once | INTRAVENOUS | Status: AC
  Administered 2016-05-31: 15:00:00 via INTRAVENOUS
  Filled 2016-05-31: qty 1000

## 2016-05-31 MED ORDER — SODIUM CHLORIDE 0.9% FLUSH
3.0000 mL | Freq: Two times a day (BID) | INTRAVENOUS | Status: DC
  Administered 2016-06-01 – 2016-06-04 (×3): 3 mL via INTRAVENOUS

## 2016-05-31 MED ORDER — POTASSIUM CHLORIDE 20 MEQ/15ML (10%) PO SOLN
40.0000 meq | Freq: Once | ORAL | Status: DC
  Filled 2016-05-31: qty 30

## 2016-05-31 MED ORDER — HEPARIN BOLUS VIA INFUSION
3100.0000 [IU] | Freq: Once | INTRAVENOUS | Status: AC
  Administered 2016-05-31: 3100 [IU] via INTRAVENOUS
  Filled 2016-05-31: qty 3100

## 2016-05-31 MED ORDER — ASPIRIN EC 81 MG PO TBEC
81.0000 mg | DELAYED_RELEASE_TABLET | Freq: Every day | ORAL | Status: DC
  Administered 2016-06-01 – 2016-06-04 (×4): 81 mg via ORAL
  Filled 2016-05-31 (×4): qty 1

## 2016-05-31 MED ORDER — POTASSIUM CHLORIDE CRYS ER 20 MEQ PO TBCR
40.0000 meq | EXTENDED_RELEASE_TABLET | Freq: Once | ORAL | Status: DC
  Filled 2016-05-31: qty 2

## 2016-05-31 MED ORDER — SODIUM CHLORIDE 0.9 % IV SOLN
INTRAVENOUS | Status: DC
  Administered 2016-05-31 – 2016-06-01 (×2): via INTRAVENOUS

## 2016-05-31 MED ORDER — HEPARIN (PORCINE) IN NACL 100-0.45 UNIT/ML-% IJ SOLN: 12.0000 [IU]/kg/h | Freq: Once | INTRAMUSCULAR | Status: DC
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
## 2016-06-01 LAB — CBC
HCT: 32.3 % — ABNORMAL LOW (ref 40.0–52.0)
Hemoglobin: 10.8 g/dL — ABNORMAL LOW (ref 13.0–18.0)
MCH: 29.3 pg (ref 26.0–34.0)
MCHC: 33.6 g/dL (ref 32.0–36.0)
MCV: 87.2 fL (ref 80.0–100.0)
PLATELETS: 146 10*3/uL — AB (ref 150–440)
RBC: 3.7 MIL/uL — AB (ref 4.40–5.90)
RDW: 16.1 % — ABNORMAL HIGH (ref 11.5–14.5)
WBC: 11.1 10*3/uL — AB (ref 3.8–10.6)

## 2016-06-01 LAB — BASIC METABOLIC PANEL
ANION GAP: 14 (ref 5–15)
BUN: 25 mg/dL — ABNORMAL HIGH (ref 6–20)
CALCIUM: 7.7 mg/dL — AB (ref 8.9–10.3)
CO2: 12 mmol/L — AB (ref 22–32)
Chloride: 114 mmol/L — ABNORMAL HIGH (ref 101–111)
Creatinine, Ser: 2.18 mg/dL — ABNORMAL HIGH (ref 0.61–1.24)
GFR, EST AFRICAN AMERICAN: 36 mL/min — AB (ref >60)
GFR, EST NON AFRICAN AMERICAN: 31 mL/min — AB (ref >60)
Glucose, Bld: 67 mg/dL (ref 65–99)
Potassium: 2.5 mmol/L — CL (ref 3.5–5.1)
SODIUM: 140 mmol/L (ref 135–145)

## 2016-06-01 LAB — HEPARIN LEVEL (UNFRACTIONATED)
HEPARIN UNFRACTIONATED: 0.21 [IU]/mL — AB (ref 0.30–0.70)
HEPARIN UNFRACTIONATED: 0.17 [IU]/mL — AB (ref 0.30–0.70)
Heparin Unfractionated: 0.38 IU/mL (ref 0.30–0.70)

## 2016-06-01 LAB — URINE DRUG SCREEN, QUALITATIVE (ARMC ONLY)
Amphetamines, Ur Screen: NOT DETECTED
Barbiturates, Ur Screen: NOT DETECTED
Benzodiazepine, Ur Scrn: NOT DETECTED
CANNABINOID 50 NG, UR ~~LOC~~: POSITIVE — AB
Cocaine Metabolite,Ur ~~LOC~~: NOT DETECTED
MDMA (ECSTASY) UR SCREEN: NOT DETECTED
METHADONE SCREEN, URINE: NOT DETECTED
Opiate, Ur Screen: NOT DETECTED
Phencyclidine (PCP) Ur S: NOT DETECTED
TRICYCLIC, UR SCREEN: NOT DETECTED

## 2016-06-01 LAB — URINALYSIS COMPLETE WITH MICROSCOPIC (ARMC ONLY)
Bilirubin Urine: NEGATIVE
GLUCOSE, UA: NEGATIVE mg/dL
NITRITE: NEGATIVE
Protein, ur: 30 mg/dL — AB
SQUAMOUS EPITHELIAL / LPF: NONE SEEN
Specific Gravity, Urine: 1.011 (ref 1.005–1.030)
pH: 6 (ref 5.0–8.0)

## 2016-06-01 LAB — POTASSIUM: POTASSIUM: 3.4 mmol/L — AB (ref 3.5–5.1)

## 2016-06-01 LAB — TROPONIN I
TROPONIN I: 1.36 ng/mL — AB (ref <0.03)
Troponin I: 0.85 ng/mL (ref <0.03)

## 2016-06-01 MED ORDER — HEPARIN BOLUS VIA INFUSION
1600.0000 [IU] | Freq: Once | INTRAVENOUS | Status: AC
  Administered 2016-06-01: 1600 [IU] via INTRAVENOUS
  Filled 2016-06-01: qty 1600

## 2016-06-01 MED ORDER — HEPARIN BOLUS VIA INFUSION
800.0000 [IU] | Freq: Once | INTRAVENOUS | Status: AC
  Administered 2016-06-01: 800 [IU] via INTRAVENOUS
  Filled 2016-06-01: qty 800

## 2016-06-01 MED ORDER — POTASSIUM CHLORIDE 20 MEQ/15ML (10%) PO SOLN
40.0000 meq | Freq: Once | ORAL | Status: AC
  Administered 2016-06-01: 40 meq via ORAL
  Filled 2016-06-01: qty 30

## 2016-06-01 MED ORDER — ONDANSETRON HCL 4 MG PO TABS
4.0000 mg | ORAL_TABLET | Freq: Four times a day (QID) | ORAL | Status: DC | PRN
  Administered 2016-06-01 – 2016-06-02 (×3): 4 mg via ORAL
  Filled 2016-06-01 (×3): qty 1

## 2016-06-01 MED ORDER — POTASSIUM CHLORIDE IN NACL 40-0.9 MEQ/L-% IV SOLN
INTRAVENOUS | Status: DC
  Administered 2016-06-01 – 2016-06-03 (×8): 150 mL/h via INTRAVENOUS
  Filled 2016-06-01 (×12): qty 1000

## 2016-06-01 MED ORDER — POTASSIUM CHLORIDE 20 MEQ/15ML (10%) PO SOLN
40.0000 meq | ORAL | Status: AC
  Administered 2016-06-01 (×2): 40 meq via ORAL
  Filled 2016-06-01 (×2): qty 30

## 2016-06-01 MED ORDER — HEPARIN BOLUS VIA INFUSION
1500.0000 [IU] | Freq: Once | INTRAVENOUS | Status: AC
  Administered 2016-06-01: 1500 [IU] via INTRAVENOUS
  Filled 2016-06-01: qty 1500

## 2016-06-01 MED ORDER — POTASSIUM CHLORIDE CRYS ER 20 MEQ PO TBCR: 40.0000 meq | EXTENDED_RELEASE_TABLET | ORAL | Status: DC | PRN

## 2016-06-01 NOTE — Progress Notes (Signed)
MEDICATION RELATED CONSULT NOTE - INITIAL   Pharmacy Consult for Electrolyte replacement Indication: hypokalemia  Allergies  Allergen Reactions  . Penicillins Anaphylaxis    Happened a long time ago, pts mother told him that he was allergic to it. Thinks he "swelled up"  . Codeine Hives    Patient Measurements: Height: 5\' 7"  (170.2 cm) Weight: 119 lb 8 oz (54.2 kg) IBW/kg (Calculated) : 66.1 Adjusted Body Weight:  Vital Signs: Temp: 99.1 F (37.3 C) (11/18 2021) Temp Source: Oral (11/18 2021) BP: 126/77 (11/18 2021) Pulse Rate: 74 (11/18 2021) Intake/Output from previous day: 11/17 0701 - 11/18 0700 In: 1780 [I.V.:1780] Out: 300 [Urine:300] Intake/Output from this shift: No intake/output data recorded.  Labs:  Recent Labs  05/31/16 1341 05/31/16 1914 06/01/16 0749  WBC 17.7*  --  11.1*  HGB 13.1  --  10.8*  HCT 39.7*  --  32.3*  PLT 190  --  146*  APTT 32  --   --   CREATININE 2.40*  --  2.18*  MG  --  2.2  --   ALBUMIN 4.5  --   --   PROT 7.7  --   --   AST 133*  --   --   ALT 37  --   --   ALKPHOS 80  --   --   BILITOT 1.2  --   --    Lab Results  Component Value Date   K 3.4 (L) 06/01/2016   Estimated Creatinine Clearance: 27.3 mL/min (by C-G formula based on SCr of 2.18 mg/dL (H)).   Microbiology: No results found for this or any previous visit (from the past 720 hour(s)).  Medical History: Past Medical History:  Diagnosis Date  . Seizures (HCC)    X10 years ago    Medications:  Scheduled:  . aspirin EC  81 mg Oral Daily  . multivitamin with minerals  1 tablet Oral Daily  . potassium chloride  40 mEq Oral Once  . potassium chloride  40 mEq Oral Once  . potassium chloride  40 mEq Oral Once  . sodium chloride flush  3 mL Intravenous Q12H  . thiamine  100 mg Oral Daily   Infusions:  . 0.9 % NaCl with KCl 40 mEq / L 150 mL/hr (06/01/16 1802)  . heparin 1,150 Units/hr (06/01/16 1635)    Assessment: 60 yo M w/ NSTEMI, hypokalemia  currently asymptomatic per Cardiology note 11/18.   Plan:  K+= 2.5,  Mag (11/17)= 2.2 Will order KCL liquid 40meq PO q2hr x 2 doses. Will add KCL to IV fluids (changefrom NS at 150 ml/hr  to NS w/ 40meq KCL at 150 ml/hr)  Will recheck K+ at 2000 (~ 6 hrs after PO KCL) and follow up electrolytes in am.  (Watch for need to adjust KCL in IV fluids)  11/18:  K @ 20:00 = 3.4  Will order KCl 40 mEq PO X 1 and recheck K on 11/19 with AM labs.   Angeliyah Kirkey D 06/01/2016,9:07 PM

## 2016-06-01 NOTE — Progress Notes (Signed)
Patient denied any acute pain this shift. No respiratory distress noted. Heparin drip at 8.5 mL/hr and a bolus of 1600 units due to low heparin level. No bleeding, new bruises or any hematoma noted. Patient is more talkative and had not refused any intervention this shift. Patient had slept less than 4 hours this shift. Will continue to monitor.
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
## 2016-06-01 NOTE — Progress Notes (Signed)
MEDICATION RELATED CONSULT NOTE - INITIAL   Pharmacy Consult for Electrolyte replacement Indication: hypokalemia  Allergies  Allergen Reactions  . Penicillins Anaphylaxis    Happened a long time ago, pts mother told him that he was allergic to it. Thinks he "swelled up"  . Codeine Hives    Patient Measurements: Height: 5\' 7"  (170.2 cm) Weight: 119 lb 8 oz (54.2 kg) IBW/kg (Calculated) : 66.1 Adjusted Body Weight:  Vital Signs: Temp: 99.1 F (37.3 C) (11/18 0811) Temp Source: Oral (11/18 0811) BP: 112/69 (11/18 0811) Pulse Rate: 56 (11/18 0811) Intake/Output from previous day: 11/17 0701 - 11/18 0700 In: 1780 [I.V.:1780] Out: 300 [Urine:300] Intake/Output from this shift: Total I/O In: -  Out: 100 [Urine:100]  Labs:  Recent Labs  05/31/16 1341 05/31/16 1914 06/01/16 0749  WBC 17.7*  --  11.1*  HGB 13.1  --  10.8*  HCT 39.7*  --  32.3*  PLT 190  --  146*  APTT 32  --   --   CREATININE 2.40*  --  2.18*  MG  --  2.2  --   ALBUMIN 4.5  --   --   PROT 7.7  --   --   AST 133*  --   --   ALT 37  --   --   ALKPHOS 80  --   --   BILITOT 1.2  --   --    Lab Results  Component Value Date   K 2.5 (LL) 06/01/2016   Estimated Creatinine Clearance: 27.3 mL/min (by C-G formula based on SCr of 2.18 mg/dL (H)).   Microbiology: No results found for this or any previous visit (from the past 720 hour(s)).  Medical History: Past Medical History:  Diagnosis Date  . Seizures (HCC)    X10 years ago    Medications:  Scheduled:  . aspirin EC  81 mg Oral Daily  . multivitamin with minerals  1 tablet Oral Daily  . potassium chloride  40 mEq Oral Once  . potassium chloride  40 mEq Oral Q2H  . potassium chloride  40 mEq Oral Once  . sodium chloride flush  3 mL Intravenous Q12H  . thiamine  100 mg Oral Daily   Infusions:  . 0.9 % NaCl with KCl 40 mEq / L    . heparin 950 Units/hr (06/01/16 0901)    Assessment: 60 yo M w/ NSTEMI, hypokalemia currently asymptomatic  per Cardiology note 11/18.   Plan:  K+= 2.5,  Mag (11/17)= 2.2 Will order KCL liquid 40meq PO q2hr x 2 doses. Will add KCL to IV fluids (changefrom NS at 150 ml/hr  to NS w/ 40meq KCL at 150 ml/hr)  Will recheck K+ at 2000 (~ 6 hrs after PO KCL) and follow up electrolytes in am.  (Watch for need to adjust KCL in IV fluids)  Yassen Kinnett A 06/01/2016,10:42 AM
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
## 2016-06-02 ENCOUNTER — Inpatient Hospital Stay: Payer: Medicare Other

## 2016-06-02 LAB — CBC
HCT: 31.3 % — ABNORMAL LOW (ref 40.0–52.0)
HEMATOCRIT: 34.5 % — AB (ref 40.0–52.0)
HEMOGLOBIN: 11.6 g/dL — AB (ref 13.0–18.0)
HEMOGLOBIN: 10.9 g/dL — AB (ref 13.0–18.0)
MCH: 29 pg (ref 26.0–34.0)
MCH: 30.2 pg (ref 26.0–34.0)
MCHC: 33.6 g/dL (ref 32.0–36.0)
MCHC: 34.9 g/dL (ref 32.0–36.0)
MCV: 86.2 fL (ref 80.0–100.0)
MCV: 86.6 fL (ref 80.0–100.0)
Platelets: 133 10*3/uL — ABNORMAL LOW (ref 150–440)
Platelets: 136 10*3/uL — ABNORMAL LOW (ref 150–440)
RBC: 4.01 MIL/uL — ABNORMAL LOW (ref 4.40–5.90)
RBC: 3.61 MIL/uL — ABNORMAL LOW (ref 4.40–5.90)
RDW: 16.5 % — ABNORMAL HIGH (ref 11.5–14.5)
RDW: 16.2 % — AB (ref 11.5–14.5)
WBC: 11.2 10*3/uL — ABNORMAL HIGH (ref 3.8–10.6)
WBC: 8.3 10*3/uL (ref 3.8–10.6)

## 2016-06-02 LAB — C DIFFICILE QUICK SCREEN W PCR REFLEX
C DIFFICLE (CDIFF) ANTIGEN: NEGATIVE
C Diff interpretation: NOT DETECTED
C Diff toxin: NEGATIVE

## 2016-06-02 LAB — BASIC METABOLIC PANEL
Anion gap: 7 (ref 5–15)
BUN: 22 mg/dL — AB (ref 6–20)
CHLORIDE: 121 mmol/L — AB (ref 101–111)
CO2: 12 mmol/L — ABNORMAL LOW (ref 22–32)
Calcium: 7.9 mg/dL — ABNORMAL LOW (ref 8.9–10.3)
Creatinine, Ser: 1.84 mg/dL — ABNORMAL HIGH (ref 0.61–1.24)
GFR calc Af Amer: 44 mL/min — ABNORMAL LOW (ref >60)
GFR calc non Af Amer: 38 mL/min — ABNORMAL LOW (ref >60)
GLUCOSE: 103 mg/dL — AB (ref 65–99)
POTASSIUM: 4 mmol/L (ref 3.5–5.1)
Sodium: 140 mmol/L (ref 135–145)

## 2016-06-02 LAB — OCCULT BLOOD X 1 CARD TO LAB, STOOL: FECAL OCCULT BLD: POSITIVE — AB

## 2016-06-02 LAB — HEPARIN LEVEL (UNFRACTIONATED): HEPARIN UNFRACTIONATED: 0.33 [IU]/mL (ref 0.30–0.70)

## 2016-06-02 MED ORDER — ENSURE ENLIVE PO LIQD
237.0000 mL | Freq: Three times a day (TID) | ORAL | Status: DC
  Administered 2016-06-02 – 2016-06-04 (×5): 237 mL via ORAL

## 2016-06-02 MED ORDER — ROSUVASTATIN CALCIUM 10 MG PO TABS
10.0000 mg | ORAL_TABLET | Freq: Every day | ORAL | Status: DC
  Administered 2016-06-02 – 2016-06-03 (×2): 10 mg via ORAL
  Filled 2016-06-02 (×2): qty 1

## 2016-06-02 MED ORDER — ALPRAZOLAM 0.5 MG PO TABS
0.5000 mg | ORAL_TABLET | Freq: Two times a day (BID) | ORAL | Status: DC
  Administered 2016-06-02 – 2016-06-04 (×5): 0.5 mg via ORAL
  Filled 2016-06-02 (×5): qty 1

## 2016-06-02 MED ORDER — SODIUM CHLORIDE 0.9 % IV SOLN
8.0000 mg/h | INTRAVENOUS | Status: DC
  Administered 2016-06-02 – 2016-06-04 (×3): 8 mg/h via INTRAVENOUS
  Filled 2016-06-02 (×4): qty 80

## 2016-06-02 MED ORDER — PANTOPRAZOLE SODIUM 40 MG PO TBEC: 40.0000 mg | DELAYED_RELEASE_TABLET | Freq: Every day | ORAL | Status: DC
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
## 2016-06-02 NOTE — Progress Notes (Signed)
Patient complained of difficulty swallowing, refused breakfast, ensure and apple sauce  was given and patient did well. Attending was made aware, new order for CT head without contrast and ensure tid.
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
## 2016-06-02 NOTE — Progress Notes (Signed)
Stool for occult blood result reported to Attending, new order to stop heparin, stat CBC andstart Protonix drip.

## 2016-06-02 NOTE — Progress Notes (Signed)
Patient observed with black smelly watery stool, Attending Dr. Bevely PalmerMade aware new order for protonix 40mg  p.o qd, stool for occult blood and cdiff. Sent.

## 2016-06-03 ENCOUNTER — Inpatient Hospital Stay: Payer: Medicare Other

## 2016-06-03 LAB — BASIC METABOLIC PANEL
ANION GAP: 6 (ref 5–15)
BUN: 19 mg/dL (ref 6–20)
CALCIUM: 7.9 mg/dL — AB (ref 8.9–10.3)
CO2: 13 mmol/L — AB (ref 22–32)
Chloride: 123 mmol/L — ABNORMAL HIGH (ref 101–111)
Creatinine, Ser: 1.71 mg/dL — ABNORMAL HIGH (ref 0.61–1.24)
GFR, EST AFRICAN AMERICAN: 48 mL/min — AB (ref >60)
GFR, EST NON AFRICAN AMERICAN: 41 mL/min — AB (ref >60)
Glucose, Bld: 110 mg/dL — ABNORMAL HIGH (ref 65–99)
Potassium: 4.5 mmol/L (ref 3.5–5.1)
Sodium: 142 mmol/L (ref 135–145)

## 2016-06-03 LAB — MAGNESIUM: Magnesium: 1.6 mg/dL — ABNORMAL LOW (ref 1.7–2.4)

## 2016-06-03 LAB — CBC
HCT: 31.6 % — ABNORMAL LOW (ref 40.0–52.0)
Hemoglobin: 10.6 g/dL — ABNORMAL LOW (ref 13.0–18.0)
MCH: 29 pg (ref 26.0–34.0)
MCHC: 33.6 g/dL (ref 32.0–36.0)
MCV: 86.4 fL (ref 80.0–100.0)
Platelets: 120 10*3/uL — ABNORMAL LOW (ref 150–440)
RBC: 3.65 MIL/uL — ABNORMAL LOW (ref 4.40–5.90)
RDW: 16.8 % — ABNORMAL HIGH (ref 11.5–14.5)
WBC: 8.4 10*3/uL (ref 3.8–10.6)

## 2016-06-03 LAB — LIPID PANEL
CHOLESTEROL: 97 mg/dL (ref 0–200)
HDL: 35 mg/dL — ABNORMAL LOW (ref >40)
LDL CALC: 42 mg/dL (ref 0–99)
TRIGLYCERIDES: 98 mg/dL (ref <150)
Total CHOL/HDL Ratio: 2.8 RATIO
VLDL: 20 mg/dL (ref 0–40)

## 2016-06-03 LAB — HEPARIN LEVEL (UNFRACTIONATED): Heparin Unfractionated: <0.1 IU/mL — ABNORMAL LOW (ref 0.30–0.70)

## 2016-06-03 MED ORDER — FUROSEMIDE 10 MG/ML IJ SOLN
20.0000 mg | Freq: Once | INTRAMUSCULAR | Status: AC
  Administered 2016-06-03: 20 mg via INTRAVENOUS
  Filled 2016-06-03: qty 2

## 2016-06-03 MED ORDER — LEVOFLOXACIN IN D5W 750 MG/150ML IV SOLN
750.0000 mg | INTRAVENOUS | Status: DC
  Administered 2016-06-03: 750 mg via INTRAVENOUS
  Filled 2016-06-03: qty 150

## 2016-06-03 MED ORDER — MAGNESIUM SULFATE 2 GM/50ML IV SOLN
2.0000 g | Freq: Once | INTRAVENOUS | Status: DC
Start: 2016-06-03 — End: 2016-06-03

## 2016-06-03 MED ORDER — MAGNESIUM SULFATE IN D5W 1-5 GM/100ML-% IV SOLN
1.0000 g | Freq: Once | INTRAVENOUS | Status: DC
  Filled 2016-06-03: qty 100

## 2016-06-03 MED ORDER — ROSUVASTATIN CALCIUM 20 MG PO TABS
20.0000 mg | ORAL_TABLET | Freq: Every day | ORAL | Status: DC
  Administered 2016-06-04: 20 mg via ORAL
  Filled 2016-06-03: qty 1

## 2016-06-03 MED ORDER — MAGNESIUM SULFATE IN D5W 1-5 GM/100ML-% IV SOLN
1.0000 g | Freq: Once | INTRAVENOUS | Status: AC
  Administered 2016-06-03: 1 g via INTRAVENOUS
  Filled 2016-06-03: qty 100

## 2016-06-03 NOTE — Progress Notes (Signed)
Patient had about 20 beats of V-tag. The RN did not see any Mg level. DR. Tobi BastosPyreddy notified with a new order for Mg level to be drawn.  No acute distress noted.  Burgundy color BM observed  x 2  this shift. Patient more relaxed and resting  after the Xanax medication. Will continue to monitor.
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
## 2016-06-03 NOTE — Progress Notes (Signed)
Pharmacy Antibiotic Note  Patrick MallStephen G Patton is a 60 y.o. male admitted on 05/31/2016 with pneumonia.  Pharmacy has been consulted for levofloxacin dosing.  Plan: Levofloxacin 750 mg IV Q48H  Height: 5\' 7"  (170.2 cm) Weight: 119 lb 8 oz (54.2 kg) IBW/kg (Calculated) : 66.1  Temp (24hrs), Avg:98.2 F (36.8 C), Min:98 F (36.7 C), Max:98.6 F (37 C)   Recent Labs Lab 05/31/16 1341 06/01/16 0749 06/02/16 0425 06/02/16 1828 06/03/16 0405  WBC 17.7* 11.1* 11.2* 8.3 8.4  CREATININE 2.40* 2.18* 1.84*  --  1.71*  LATICACIDVEN 1.2  --   --   --   --     Estimated Creatinine Clearance: 34.8 mL/min (by C-G formula based on SCr of 1.71 mg/dL (H)).    Allergies  Allergen Reactions  . Penicillins Anaphylaxis    Happened a long time ago, pts mother told him that he was allergic to it. Thinks he "swelled up"  . Codeine Hives    Thank you for allowing pharmacy to be a part of this patient's care.  Carola FrostNathan A Qamar Rosman, Pharm.D., BCPS Clinical Pharmacist 06/03/2016 11:23 PM

## 2016-06-03 NOTE — Progress Notes (Signed)
Mg level was 1.6, Dr. Tobi BastosPyreddy notified with a new order for 1 gm of Mg x 1 dose.  Will continue to monitor.

## 2016-06-03 NOTE — Progress Notes (Addendum)
[facility] [place] [facility]   PATIENT NAME: Patrick BallerStephen Patton    MR#:  [id number]  DATE OF BIRTH:  07/01/1956  SUBJECTIVE: Admitted for altered mental status, /hypokalemia/nstemi. He feels very sleepy today during my examination and falling asleep. Patient says that he is very anxious and stressed out and requesting Xanax.   CHIEF COMPLAINT:   Chief Complaint  Patient presents with  . Altered Mental Status    REVIEW OF SYSTEMS:   ROS CONSTITUTIONAL: No fever, fatigue or weakness.  EYES: No blurred or double vision.  EARS, NOSE, AND THROAT: No tinnitus or ear pain.  RESPIRATORY: No cough, shortness of breath, wheezing or hemoptysis.  CARDIOVASCULAR: No chest pain, orthopnea, edema.  GASTROINTESTINAL: No nausea, vomiting, diarrhea or abdominal pain.  GENITOURINARY: No dysuria, hematuria.  ENDOCRINE: No polyuria, nocturia,  HEMATOLOGY: No anemia, easy bruising or bleeding SKIN: No rash or lesion. MUSCULOSKELETAL: No joint pain or arthritis.   NEUROLOGIC: No tingling, numbness, weakness.  PSYCHIATRY: No anxiety or depression.   DRUG ALLERGIES:   Allergies  Allergen Reactions  . Penicillins Anaphylaxis    Happened a long time ago, pts mother told him that he was allergic to it. Thinks he "swelled up"  . Codeine Hives    VITALS:  Blood pressure (!) 143/79, pulse 85, temperature 98.1 F (36.7 C), resp. rate 20, height 5\' 7"  (1.702 m), weight 54.2 kg (119 lb 8 oz), SpO2 96 %.  PHYSICAL EXAMINATION:  GENERAL:  60 y.o.-year-old patient lying in the bed with no acute distress.  EYES: Pupils equal, round, reactive to light and accommodation. No scleral icterus. Extraocular muscles intact.  HEENT: Head atraumatic, normocephalic. Oropharynx and nasopharynx clear.  NECK:  Supple, no jugular venous distention. No thyroid enlargement, no tenderness.  LUNGS: Normal breath sounds bilaterally, no wheezing, rales,rhonchi or crepitation. No use of accessory  muscles of respiration.  CARDIOVASCULAR: S1, S2 normal. No murmurs, rubs, or gallops.  ABDOMEN: Soft, nontender, nondistended. Bowel sounds present. No organomegaly or mass.  EXTREMITIES: No pedal edema, cyanosis, or clubbing.  NEUROLOGIC: Cranial nerves II through XII are intact. Muscle strength 5/5 in all extremities. Sensation intact. Gait not checked.  PSYCHIATRIC: The patient is alert and oriented x 3.  SKIN: No obvious rash, lesion, or ulcer.    LABORATORY PANEL:   CBC  Recent Labs Lab 06/03/16 0405  WBC 8.4  HGB 10.6*  HCT 31.6*  PLT 120*   ------------------------------------------------------------------------------------------------------------------  Chemistries   Recent Labs Lab 05/31/16 1341  06/03/16 0405  NA 141  < > 142  K 2.3*  < > 4.5  CL 107  < > 123*  CO2 21*  < > 13*  GLUCOSE 89  < > 110*  BUN 26*  < > 19  CREATININE 2.40*  < > 1.71*  CALCIUM 9.3  < > 7.9*  MG  --   < > 1.6*  AST 133*  --   --   ALT 37  --   --   ALKPHOS 80  --   --   BILITOT 1.2  --   --   < > = values in this interval not displayed. ------------------------------------------------------------------------------------------------------------------  Cardiac Enzymes  Recent Labs Lab 06/01/16 0749  TROPONINI 0.85*   ------------------------------------------------------------------------------------------------------------------  RADIOLOGY:  Ct Head Wo Contrast  Result Date: 06/02/2016 CLINICAL DATA:  Difficulty swallowing.  History seizures.  Weakness. EXAM: CT HEAD WITHOUT CONTRAST TECHNIQUE: Contiguous axial images were obtained from the base of the skull through the  vertex without intravenous contrast. COMPARISON:  05/31/2016. FINDINGS: Brain: Mild cerebral atrophy for age. Cerebellar atrophy as well. No mass lesion, hemorrhage, hydrocephalus, acute infarct, intra-axial, or extra-axial fluid collection. Vascular: Intracranial carotid atherosclerosis. Skull: No  significant soft tissue swelling.  Normal skull. Sinuses/Orbits: Normal orbits and globes. Clear paranasal sinuses and mastoid air cells. Other: None IMPRESSION: 1.  No acute intracranial abnormality. 2. Cerebral and cerebellar atrophy. Electronically Signed   By: Jeronimo GreavesKyle  Talbot M.D.   On: 06/02/2016 13:55   [place] Renal  Result Date: 06/02/2016 CLINICAL DATA:  Inpatient with renal failure. History of nephrolithiasis. EXAM: RENAL / URINARY TRACT ULTRASOUND COMPLETE COMPARISON:  04/16/2010 CT abdomen/pelvis. FINDINGS: Right Kidney: Length: 10.8 cm. Mildly echogenic right kidney. No right hydronephrosis. Nonobstructing 4 mm lower right renal stone. Simple appearing 0.6 cm renal cyst in the upper right kidney. Left Kidney: Length: 9.6 cm. Limited visualization of the left kidney due to bowel gas and patient related factors. Mildly echogenic left kidney. No left hydronephrosis. No left renal mass. Bladder: Relatively collapsed and grossly normal bladder. IMPRESSION: 1. No hydronephrosis. 2. Mildly echogenic normal size kidneys, indicating nonspecific renal parenchymal disease of uncertain chronicity. 3. Nonobstructing right lower renal stone. 4. Normal bladder. Electronically Signed   By: Delbert PhenixJason A Poff M.D.   On: 06/02/2016 17:26    EKG:   Orders placed or performed during the hospital encounter of 05/31/16  . ED EKG  . ED EKG  . EKG 12-Lead  . EKG 12-Lead  . EKG 12-Lead  . EKG 12-Lead    ASSESSMENT AND PLAN:   #1 syncope likely secondary to hypokalemia, acute renal failure, non-ST elevation MI.' Clinically better   #2 acute renal failure likely due to ATN:  Continue IV fluids, monitor kidney function closely; kidney the function is improving,  Normal renal ultrasound. Outpatient follow-up with nephrology as recommended as per my discussion with Dr. Thedore MinsSingh  #3 hypokalemiaAnd hypomagnesemia replace magnesium and  potassium, pharmacy was consulted for potassium replacement Repeat a.m. labs   #4  non-ST elevation MI, troponin up to 1.60.  continue aspirin, and statin Discontinued heparin drip as stool was positive for Hemoccult  Hemoglobin stable  Patient needs IV hydration Cardiology is recommending to ablate the patient in the hallway, is ambulating well and continue to improve DISCHARGE patient from cardiology standpoint with further diagnostic testing including stress test and medication adjustment  as outpatient  #5 anxiety: added the Xanax    Consult physical therapy Plan of care discussed with the RN Abigail     All the records are reviewed and case discussed with Care Management/Social Workerr. Management plans discussed with the patient, family and they are in agreement.  CODE STATUS: full  TOTAL TIME TAKING CARE OF THIS PATIENT: 35 minutes.   POSSIBLE D/C IN 1-2 DAYS, DEPENDING ON CLINICAL CONDITION.   Ramonita LabGouru, Darina Hartwell M.D on 06/03/2016 at 2:49 PM  Between 7am to 6pm - Pager - [phone]  After 6pm go to [email or web address] - password [email or web address] [facility]  [facility]  Office  [phone]  CC: Primary care physician; Lissa MoralesHARRISON, MATTHEW P, MD   Note: This dictation was prepared with Dragon dictation along with smaller phrase technology. Any transcriptional errors that result from this process are unintentional.

## 2016-06-03 NOTE — Progress Notes (Signed)
MD made aware that pt is c/o "pain when breathing hard" crackles auscultated to the lower bases and expiratory wheezing to the right lower lung. MD willis has stopped fluids and has ordered a chest x-ray.

## 2016-06-03 NOTE — Evaluation (Signed)
Physical Therapy Evaluation Patient Details Name: Patrick MallStephen G Hagenow MRN: [id number] DOB: 11/29/1955 Today's Date: 06/03/2016   History of Present Illness  Pt is a 60 y.o. male with no evidence of previous cardiovascular disease having an episode of syncope and hypokalemia causing abnormal EKG and elevated troponin consistent with non-ST elevation myocardial infarction with acute kidney disease now improving with no further symptoms consistent with heart failure and or true angina.  Clinical Impression  Pt presents with deficits in strength, transfers, mobility, gait, balance, and activity tolerance.  Pt requires extra time and effort and SBA with all bed mobility tasks with use of bed rail and therapeutic rest break after effort of going sup to sit.  Pt required CGA with sit to/from stand transfers and was able to amb holding IV pole on 2.5LO2/min a distance of 15' before requiring therapeutic rest break.  Pt's HR ranged from 75 bpm at rest to 80-84 bpm with activity with SpO2 holding at 96-97% throughout session.  Pt will benefit from PT services to address above deficits for decreased caregiver assistance upon discharge.          Follow Up Recommendations SNF    Equipment Recommendations  Rolling walker with 5" wheels    Recommendations for Other Services       Precautions / Restrictions Precautions Precautions: Fall Restrictions Weight Bearing Restrictions: No      Mobility  Bed Mobility Overal bed mobility: Needs Assistance Bed Mobility: Supine to Sit;Sit to Supine     Supine to sit: Supervision Sit to supine: Supervision   General bed mobility comments: Extra time and effort alone with side rail needed for all bed mobility tasks  Transfers Overall transfer level: Needs assistance Equipment used: Rolling walker (2 wheeled) Transfers: Sit to/from Stand Sit to Stand: Min guard            Ambulation/Gait Ambulation/Gait assistance: Min guard Ambulation Distance  (Feet): 15 Feet Assistive device:  (Holding IV pole) Gait Pattern/deviations: Step-through pattern;Decreased step length - right;Decreased step length - left;Trunk flexed   Gait velocity interpretation: Below normal speed for age/gender General Gait Details: HR increased from 75 to 80 bpm after short amb with SpO2 decreasing from 97% to 96% on 2.5LO2/min.  Amb very effortful with min-mod SOB after amb 15'.  Stairs Stairs:  (deferred)          Wheelchair Mobility    Modified Rankin (Stroke Patients Only)       Balance Overall balance assessment: Needs assistance Sitting-balance support: No upper extremity supported Sitting balance-Leahy Scale: Good     Standing balance support: Bilateral upper extremity supported Standing balance-Leahy Scale: Fair                               Pertinent Vitals/Pain Pain Assessment: No/denies pain    Home Living Family/patient expects to be discharged to:: Private residence Living Arrangements: Alone Available Help at Discharge: Family;Available PRN/intermittently Type of Home: Apartment (Son is looking into finding pt a different apt with less steps that is closer to son's house.) Home Access: Stairs to enter Entrance Stairs-Rails: Left Entrance Stairs-Number of Steps: 21 Home Layout: One level Home Equipment: Cane - single point      Prior Function Level of Independence: Independent         Comments: Ind amb mostly household distances without AD with occasional use of SPC "if I feel weak".  Ind with ADLs with 2-3 falls in  last year all from syncope with pt having no memory of any of the falls.      Hand Dominance   Dominant Hand: Left    Extremity/Trunk Assessment   Upper Extremity Assessment: Generalized weakness           Lower Extremity Assessment: Generalized weakness         Communication   Communication: No difficulties  Cognition Arousal/Alertness: Awake/alert Behavior During Therapy: WFL  for tasks assessed/performed Overall Cognitive Status: Within Functional Limits for tasks assessed                      General Comments      Exercises Total Joint Exercises Ankle Circles/Pumps: AROM;Both;10 reps;15 reps Quad Sets: AROM;Both;10 reps;15 reps Gluteal Sets: AROM;Both;10 reps Long Arc Quad: AROM;Both;10 reps;15 reps Knee Flexion: AROM;Both;10 reps;15 reps Marching in Standing: AROM;Both;10 reps Other Exercises Other Exercises: Seated B hip flex 2 x 10    Assessment/Plan    PT Assessment Patient needs continued PT services  PT Problem List Decreased strength;Decreased activity tolerance;Decreased balance;Decreased mobility          PT Treatment Interventions Gait training;Stair training;Functional mobility training;DME instruction;Therapeutic activities;Therapeutic exercise;Balance training;Neuromuscular re-education;Patient/family education    PT Goals (Current goals can be found in the Care Plan section)  Acute Rehab PT Goals Patient Stated Goal: I want to be able to walk for exercise PT Goal Formulation: With patient Time For Goal Achievement: 06/16/16 Potential to Achieve Goals: Fair    Frequency Min 2X/week   Barriers to discharge Inaccessible home environment Pt has 21 steps to enter current apartment and at time of eval does not have the necessary activity tolerance/strength to safely ascend/descend steps    Co-evaluation               End of Session Equipment Utilized During Treatment: Gait belt;Oxygen Activity Tolerance: Patient limited by fatigue Patient left: in chair;with chair alarm set;with call bell/phone within reach           Time: 1400-1436 PT Time Calculation (min) (ACUTE ONLY): 36 min   Charges:   PT Evaluation $PT Eval Moderate Complexity: 1 Procedure PT Treatments $Therapeutic Exercise: 8-22 mins   PT G Codes:        Elly Modena. Scott Charniece Venturino PT, DPT 06/03/16, 4:08 PM

## 2016-06-03 NOTE — Progress Notes (Signed)
MEDICATION RELATED CONSULT NOTE - FOLLOW UP   Pharmacy Consult for Electrolyte replacement Indication: hypokalemia  Allergies  Allergen Reactions  . Penicillins Anaphylaxis    Happened a long time ago, pts mother told him that he was allergic to it. Thinks he "swelled up"  . Codeine Hives    Patient Measurements: Height: 5\' 7"  (170.2 cm) Weight: 119 lb 8 oz (54.2 kg) IBW/kg (Calculated) : 66.1 Adjusted Body Weight:  Vital Signs: Temp: 98.1 F (36.7 C) (11/20 1145) Temp Source: Oral (11/20 0929) BP: 143/79 (11/20 1145) Pulse Rate: 85 (11/20 1145) Intake/Output from previous day: 11/19 0701 - 11/20 0700 In: [id number] [P.O.:240; I.V.:18300; IV Piggyback:100] Out: 1900 [Urine:1900] Intake/Output from this shift: Total I/O In: -  Out: 500 [Urine:500]  Labs:  Recent Labs  05/31/16 1341 05/31/16 1914 06/01/16 0749 06/02/16 0425 06/02/16 1828 06/03/16 0405  WBC 17.7*  --  11.1* 11.2* 8.3 8.4  HGB 13.1  --  10.8* 11.6* 10.9* 10.6*  HCT 39.7*  --  32.3* 34.5* 31.3* 31.6*  PLT 190  --  146* 133* 136* 120*  APTT 32  --   --   --   --   --   CREATININE 2.40*  --  2.18* 1.84*  --  1.71*  MG  --  2.2  --   --   --  1.6*  ALBUMIN 4.5  --   --   --   --   --   PROT 7.7  --   --   --   --   --   AST 133*  --   --   --   --   --   ALT 37  --   --   --   --   --   ALKPHOS 80  --   --   --   --   --   BILITOT 1.2  --   --   --   --   --    Lab Results  Component Value Date   K 4.5 06/03/2016   Estimated Creatinine Clearance: 34.8 mL/min (by C-G formula based on SCr of 1.71 mg/dL (H)).   Microbiology: Recent Results (from the past 720 hour(s))  C difficile quick scan w PCR reflex     Status: None   Collection Time: 06/02/16  4:58 PM  Result Value Ref Range Status   C Diff antigen NEGATIVE NEGATIVE Final   C Diff toxin NEGATIVE NEGATIVE Final   C Diff interpretation No C. difficile detected.  Final    Medical History: Past Medical History:  Diagnosis Date  . Seizures  (HCC)    X10 years ago    Medications:  Scheduled:  . ALPRAZolam  0.5 mg Oral BID  . aspirin EC  81 mg Oral Daily  . feeding supplement (ENSURE ENLIVE)  237 mL Oral TID  . multivitamin with minerals  1 tablet Oral Daily  . potassium chloride  40 mEq Oral Once  . potassium chloride  40 mEq Oral Once  . rosuvastatin  10 mg Oral Daily  . sodium chloride flush  3 mL Intravenous Q12H  . thiamine  100 mg Oral Daily   Infusions:  . 0.9 % NaCl with KCl 40 mEq / L 150 mL/hr (06/03/16 0939)  . heparin Stopped (06/02/16 1805)  . pantoprozole (PROTONIX) infusion 8 mg/hr (06/02/16 1840)    Assessment: 60 yo M w/ NSTEMI, hypokalemia currently asymptomatic per Cardiology note 11/18.   Plan:  Electrolytes  wnl except Magnesium= 1.6.  MD Pyreddy replaced with Magnesium 1 g IV x 1. Will recheck labs in am.  Finnegan Gatta D 06/03/2016,11:59 AM

## 2016-06-03 NOTE — Care Management (Deleted)
CM made attempt to assess.  Patient sleeping soundly  and does not awaken when name called x 2

## 2016-06-03 NOTE — Progress Notes (Signed)
ANTICOAGULATION CONSULT NOTE - FOLLOW UP   Pharmacy Consult for Heparin Drip  Indication: chest pain/ACS  Allergies  Allergen Reactions  . Penicillins Anaphylaxis    Happened a long time ago, pts mother told him that he was allergic to it. Thinks he "swelled up"  . Codeine Hives    Patient Measurements: Height: 5\' 7"  (170.2 cm) Weight: 119 lb 8 oz (54.2 kg) IBW/kg (Calculated) : 66.1 Heparin Dosing Weight: 52.2 kg  Vital Signs: Temp: 98.2 F (36.8 C) (11/20 0511) Temp Source: Oral (11/19 2013) BP: 133/72 (11/20 0511) Pulse Rate: 73 (11/20 0511)  Labs:  Recent Labs  05/31/16 1341 05/31/16 1914  06/01/16 0004 06/01/16 0749  06/01/16 2228 06/02/16 0425 06/02/16 1828 06/03/16 0405  HGB 13.1  --   --   --  10.8*  --   --  11.6* 10.9* 10.6*  HCT 39.7*  --   --   --  32.3*  --   --  34.5* 31.3* 31.6*  PLT 190  --   --   --  146*  --   --  133* 136* 120*  APTT 32  --   --   --   --   --   --   --   --   --   LABPROT 13.8  --   --   --   --   --   --   --   --   --   INR 1.06  --   --   --   --   --   --   --   --   --   HEPARINUNFRC  --   --   < >  --  0.21*  < > 0.38 0.33  --  <0.10*  CREATININE 2.40*  --   --   --  2.18*  --   --  1.84*  --   --   CKTOTAL 1,815*  --   --   --   --   --   --   --   --   --   TROPONINI 2.55* 1.60*  --  1.36* 0.85*  --   --   --   --   --   < > = values in this interval not displayed.  Estimated Creatinine Clearance: 32.3 mL/min (by C-G formula based on SCr of 1.84 mg/dL (H)).   Medical History: Past Medical History:  Diagnosis Date  . Seizures (HCC)    X10 years ago    Medications:  Scheduled:  . ALPRAZolam  0.5 mg Oral BID  . aspirin EC  81 mg Oral Daily  . feeding supplement (ENSURE ENLIVE)  237 mL Oral TID  . multivitamin with minerals  1 tablet Oral Daily  . potassium chloride  40 mEq Oral Once  . potassium chloride  40 mEq Oral Once  . rosuvastatin  10 mg Oral Daily  . sodium chloride flush  3 mL Intravenous Q12H  .  thiamine  100 mg Oral Daily   Infusions:  . 0.9 % NaCl with KCl 40 mEq / L 150 mL/hr (06/03/16 0259)  . heparin Stopped (06/02/16 1805)  . pantoprozole (PROTONIX) infusion 8 mg/hr (06/02/16 1840)    Assessment: 60 yo M with AMS, ACS/STEMI to start Heparin drip. No home meds for Anticoagulation.  Baseline labs: Hgb 13.1  Plt 190  INR 1.06    APTT 32 Scr 2.40 Crcl 23.9 ml/min  Goal of Therapy:  Heparin  level 0.3-0.7 units/ml Monitor platelets by anticoagulation protocol: Yes   Plan:  Give 3100 units bolus x 1 Start heparin infusion at 650 units/hr Check anti-Xa level in 8 hours and daily while on heparin Continue to monitor H&H and platelets  11/18 00:00 heparin level <0.1. 1600 unit bolus and increase rate to 850 units/hr. Recheck in 6 hours.  11/18: Heparin level resulted @ 0.21. Will give 800 units x 1 bolus and will increase heparin gtt rate 950 units/hr. Will recheck heparin level in 6 hours.   11/18: HL @ 15:14 = 0.17.  Will order 1500 units IV X 1 bolus and increase drip rate to 1150 units/hr and recheck HL 6 hrs after rate change.   11/18 PM heparin level 0.38. Continue current regimen. Recheck in 6 hours to confirm.   11/19 AM heparin level 0.33. Continue current regimen. Recheck heparin level and CBC tomorrow AM.  11/20 AM heparin level <0.1. Per RN heparin drip is off due to bleed.    Breydon Senters S 06/03/2016,5:26 AM

## 2016-06-04 LAB — BASIC METABOLIC PANEL
Anion gap: 4 — ABNORMAL LOW (ref 5–15)
BUN: 15 mg/dL (ref 6–20)
CALCIUM: 8.1 mg/dL — AB (ref 8.9–10.3)
CHLORIDE: 116 mmol/L — AB (ref 101–111)
CO2: 18 mmol/L — ABNORMAL LOW (ref 22–32)
CREATININE: 1.75 mg/dL — AB (ref 0.61–1.24)
GFR calc Af Amer: 47 mL/min — ABNORMAL LOW (ref >60)
GFR calc non Af Amer: 40 mL/min — ABNORMAL LOW (ref >60)
Glucose, Bld: 100 mg/dL — ABNORMAL HIGH (ref 65–99)
Potassium: 4.2 mmol/L (ref 3.5–5.1)
SODIUM: 138 mmol/L (ref 135–145)

## 2016-06-04 LAB — MAGNESIUM: Magnesium: 1.7 mg/dL (ref 1.7–2.4)

## 2016-06-04 MED ORDER — OXYCODONE HCL 10 MG PO TABS: 10.0000 mg | ORAL_TABLET | Freq: Four times a day (QID) | ORAL | 0 refills | Status: DC | PRN

## 2016-06-04 MED ORDER — PANTOPRAZOLE SODIUM 40 MG PO TBEC: 40.0000 mg | DELAYED_RELEASE_TABLET | Freq: Two times a day (BID) | ORAL | 0 refills | Status: DC

## 2016-06-04 MED ORDER — ENSURE ENLIVE PO LIQD: 237.0000 mL | Freq: Three times a day (TID) | ORAL | 12 refills | Status: AC

## 2016-06-04 MED ORDER — THIAMINE HCL 100 MG PO TABS: 100.0000 mg | ORAL_TABLET | Freq: Every day | ORAL | Status: DC

## 2016-06-04 MED ORDER — ALPRAZOLAM 0.5 MG PO TABS: 0.5000 mg | ORAL_TABLET | Freq: Two times a day (BID) | ORAL | 0 refills | Status: AC

## 2016-06-04 MED ORDER — ASPIRIN 81 MG PO TBEC: 81.0000 mg | DELAYED_RELEASE_TABLET | Freq: Every day | ORAL | Status: AC

## 2016-06-04 MED ORDER — ROSUVASTATIN CALCIUM 20 MG PO TABS: 20.0000 mg | ORAL_TABLET | Freq: Every day | ORAL | 0 refills | Status: DC

## 2016-06-04 MED ORDER — ADULT MULTIVITAMIN W/MINERALS CH: 1.0000 | ORAL_TABLET | Freq: Every day | ORAL | Status: AC

## 2016-06-04 MED ORDER — LEVOFLOXACIN 750 MG PO TABS: 750.0000 mg | ORAL_TABLET | ORAL | 0 refills | Status: AC

## 2016-06-04 NOTE — Care Management Important Message (Signed)
Important Message  Patient Details  Name: Patrick Patton MRN: [id number] Date of Birth: 09/10/1955   Medicare Important Message Given:  Yes    Eber HongGreene, Micki Cassel R, RN 06/04/2016, 2:53 PM

## 2016-06-04 NOTE — Progress Notes (Signed)
Patient taken off the floor via ems on ra. No distress noted. Iv removed x2, telemetry box removed. Packet given to ems

## 2016-06-04 NOTE — Clinical Social Work Note (Addendum)
MSW met with patient to discuss SNF recommendation by PT.  Patient stated he was worried that he can not afford it, MSW explained to patient how insurance will pay for stay and what the process is for going to SNF for short term rehab.  Patient was explained what to expect at SNF and what role of MSW is in regards to assisting with finding placement for patient.  Patient is in agreement to going to SNF for short term rehab.  MSW was given permission to fax out to [facility] for SNF placement.  3:15pm  MSW presented bed offers to patient, he would like to go to [facility], MSW contacted SNF and they can accept patient today.  MSW updated patient and his sister Lance Sell per his request [phone].  Patient to be d/c'ed today to [facility].  Patient and family agreeable to plans will transport via ems RN to call report to room [place] [phone].  Jones Broom. Norval Morton, MSW [phone]  Mon-Fri 8a-4:30p 06/04/2016 12:26 PM

## 2016-06-04 NOTE — Discharge Instructions (Signed)
Activity as tolerated per PT recommendations at skilled nursing facility Follow up with primary care physician at the facility in 2-3 days Follow-up with the cardiology Dr. Gwen PoundsKowalski in a week Follow-up with nephrology Dr. Thedore MinsSingh in 2 weeks

## 2016-06-04 NOTE — Progress Notes (Signed)
Patient was able to ambulate around the nursing station  once, pt's breathing improved after receiving a 1x order of 20 mg Lasix. During ambulation, pt's 02 saturations remained in the 91-93% on RA, patient did c/o weakness and SOB while ambulating. Back in bed pt's oxygen level is at 98% on room air. Will continue to monitor.

## 2016-06-04 NOTE — Clinical Social Work Placement (Signed)
   CLINICAL SOCIAL WORK PLACEMENT  NOTE  Date:  06/04/2016  Patient Details  Name: Patrick MallStephen G Zimmerle MRN: [id number] Date of Birth: 05/25/1956  Clinical Social Work is seeking post-discharge placement for this patient at the Skilled  Nursing Facility level of care (*CSW will initial, date and re-position this form in  chart as items are completed):  Yes   Patient/family provided with [facility] Clinical Social Work Department's list of facilities offering this level of care within the geographic area requested by the patient (or if unable, by the patient's family).  Yes   Patient/family informed of their freedom to choose among providers that offer the needed level of care, that participate in Medicare, Medicaid or managed care program needed by the patient, have an available bed and are willing to accept the patient.  Yes   Patient/family informed of [facility] ownership interest in [facility] and [facility], as well as of the fact that they are under no obligation to receive care at these facilities.  PASRR submitted to EDS on 06/04/16     PASRR number received on       Existing PASRR number confirmed on 06/04/16     FL2 transmitted to all facilities in geographic area requested by pt/family on 06/04/16     FL2 transmitted to all facilities within larger geographic area on       Patient informed that his/her managed care company has contracts with or will negotiate with certain facilities, including the following:        Yes   Patient/family informed of bed offers received.  [facility]     Physician recommends and patient chooses bed at      Patient to be transferred to [facility] [place] on 06/04/16.  [facility]     Patient family notified on 06/04/16 of transfer.  Name of family member notified:  Burlene ArntCheek,Sandra Sister [phone]      PHYSICIAN       Additional  Comment:    _______________________________________________ Darleene CleaverAnterhaus, Keiland Pickering R 06/04/2016, 3:29 PM
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
## 2016-06-04 NOTE — NC FL2 (Signed)
[place] MEDICAID FL2 LEVEL OF CARE SCREENING TOOL     IDENTIFICATION  Patient Name: Patrick MallStephen G Haskew Birthdate: 05/21/1956 Sex: male Admission Date (Current Location): 05/31/2016  [place]   Facility and Address:  [facility], [address], [place], [place] [address]      Provider Number: [id number]  Attending Physician Name and Address:  Ramonita LabAruna Tyrail Grandfield, MD  Relative Name and Phone Number:  Burlene ArntCheek,Sandra Sister [phone]     Current Level of Care: Hospital Recommended Level of Care: Skilled Nursing Facility Prior Approval Number:    Date Approved/Denied:   PASRR Number: [id number]  Discharge Plan: SNF    Current Diagnoses: Patient Active Problem List   Diagnosis Date Noted  . Non-ST elevated myocardial infarction (HCC) 05/31/2016    Orientation RESPIRATION BLADDER Height & Weight     Self, Time, Situation, Place  Normal Continent Weight: 119 lb 8 oz (54.2 kg) Height:  5\' 7"  (170.2 cm)  BEHAVIORAL SYMPTOMS/MOOD NEUROLOGICAL BOWEL NUTRITION STATUS      Continent Diet (Regular diet)  AMBULATORY STATUS COMMUNICATION OF NEEDS Skin   Limited Assist Verbally Normal                       Personal Care Assistance Level of Assistance  Bathing, Dressing, Feeding Bathing Assistance: Limited assistance Feeding assistance: Independent Dressing Assistance: Limited assistance     Functional Limitations Info  Sight, Hearing, Speech Sight Info: Adequate Hearing Info: Adequate Speech Info: Adequate    SPECIAL CARE FACTORS FREQUENCY  PT (By licensed PT)     PT Frequency: 5x a week              Contractures Contractures Info: Present    Additional Factors Info  Psychotropic     Allergies     Psychotropic Info: ALPRAZolam (XANAX) tablet 0.5 mg   PENICILLINS, CODEINE        Current Medications (06/04/2016):  This is the current hospital active medication list Current Facility-Administered  Medications  Medication Dose Route Frequency Provider Last Rate Last Dose  . ALPRAZolam Prudy Feeler(XANAX) tablet 0.5 mg  0.5 mg Oral BID Katha HammingSnehalatha Konidena, MD   0.5 mg at 06/04/16 0914  . aspirin EC tablet 81 mg  81 mg Oral Daily Gracelyn NurseJohn D Johnston, MD   81 mg at 06/04/16 [id number]  . feeding supplement (ENSURE ENLIVE) (ENSURE ENLIVE) liquid 237 mL  237 mL Oral TID Katha HammingSnehalatha Konidena, MD   237 mL at 06/04/16 1046  . levofloxacin (LEVAQUIN) IVPB 750 mg  750 mg Intravenous Q48H Ramonita LabAruna Maysen Bonsignore, MD   750 mg at 06/03/16 2330  . multivitamin with minerals tablet 1 tablet  1 tablet Oral Daily Gracelyn NurseJohn D Johnston, MD   1 tablet at 06/04/16 0914  . ondansetron (ZOFRAN) tablet 4 mg  4 mg Oral Q6H PRN Katha HammingSnehalatha Konidena, MD   4 mg at 06/02/16 2127  . pantoprazole (PROTONIX) 80 mg in sodium chloride 0.9 % 250 mL (0.32 mg/mL) infusion  8 mg/hr Intravenous Continuous Katha HammingSnehalatha Konidena, MD 25 mL/hr at 06/04/16 0207 8 mg/hr at 06/04/16 0207  . potassium chloride 20 MEQ/15ML (10%) solution 40 mEq  40 mEq Oral Once Emily FilbertJonathan E Williams, MD      . potassium chloride SA (K-DUR,KLOR-CON) CR tablet 40 mEq  40 mEq Oral Once Emily FilbertJonathan E Williams, MD      . rosuvastatin (CRESTOR) tablet 20 mg  20 mg Oral Daily Ramonita LabAruna Kester Stimpson, MD   20 mg at  06/04/16 0914  . sodium chloride flush (NS) 0.9 % injection 3 mL  3 mL Intravenous Q12H Gracelyn NurseJohn D Johnston, MD   3 mL at 06/04/16 0914  . thiamine (VITAMIN B-1) tablet 100 mg  100 mg Oral Daily Gracelyn NurseJohn D Johnston, MD   100 mg at 06/04/16 [id number]     Discharge Medications: Please see discharge summary for a list of discharge medications.  Relevant Imaging Results:  Relevant Lab Results:   Additional Information SSN [id number]  Darleene Cleavernterhaus, Eric R

## 2016-06-04 NOTE — Progress Notes (Signed)
Report called to [facility] patient status gone over with rn. Patient will be transport via ems on RA. No distress noted. Packet will be given to ems.

## 2016-07-03 DIAGNOSIS — I214 Non-ST elevation (NSTEMI) myocardial infarction: Secondary | ICD-10-CM | POA: Insufficient documentation

## 2016-12-30 ENCOUNTER — Inpatient Hospital Stay: Payer: Medicare Other

## 2016-12-30 ENCOUNTER — Emergency Department: Payer: Medicare Other

## 2016-12-30 ENCOUNTER — Encounter: Payer: Self-pay | Admitting: Emergency Medicine

## 2016-12-30 ENCOUNTER — Inpatient Hospital Stay
Admission: EM | Admit: 2016-12-30 | Discharge: 2017-01-03 | DRG: 871 | Disposition: A | Discharge status: Home Health | Payer: Medicare Other | Attending: Internal Medicine | Admitting: Internal Medicine

## 2016-12-30 DIAGNOSIS — I2510 Atherosclerotic heart disease of native coronary artery without angina pectoris: Secondary | ICD-10-CM | POA: Diagnosis present

## 2016-12-30 DIAGNOSIS — I4891 Unspecified atrial fibrillation: Secondary | ICD-10-CM | POA: Diagnosis present

## 2016-12-30 DIAGNOSIS — R739 Hyperglycemia, unspecified: Secondary | ICD-10-CM | POA: Diagnosis present

## 2016-12-30 DIAGNOSIS — R569 Unspecified convulsions: Secondary | ICD-10-CM

## 2016-12-30 DIAGNOSIS — I360 Nonrheumatic tricuspid (valve) stenosis: Secondary | ICD-10-CM | POA: Diagnosis not present

## 2016-12-30 DIAGNOSIS — I248 Other forms of acute ischemic heart disease: Secondary | ICD-10-CM | POA: Diagnosis present

## 2016-12-30 DIAGNOSIS — Z885 Allergy status to narcotic agent status: Secondary | ICD-10-CM | POA: Diagnosis not present

## 2016-12-30 DIAGNOSIS — Z880 Allergy status to penicillin: Secondary | ICD-10-CM

## 2016-12-30 DIAGNOSIS — Z9081 Acquired absence of spleen: Secondary | ICD-10-CM

## 2016-12-30 DIAGNOSIS — R000 Tachycardia, unspecified: Secondary | ICD-10-CM | POA: Diagnosis not present

## 2016-12-30 DIAGNOSIS — R6521 Severe sepsis with septic shock: Secondary | ICD-10-CM | POA: Diagnosis present

## 2016-12-30 DIAGNOSIS — R748 Abnormal levels of other serum enzymes: Secondary | ICD-10-CM | POA: Diagnosis not present

## 2016-12-30 DIAGNOSIS — A419 Sepsis, unspecified organism: Principal | ICD-10-CM | POA: Diagnosis present

## 2016-12-30 DIAGNOSIS — N179 Acute kidney failure, unspecified: Secondary | ICD-10-CM | POA: Diagnosis present

## 2016-12-30 DIAGNOSIS — I252 Old myocardial infarction: Secondary | ICD-10-CM | POA: Diagnosis not present

## 2016-12-30 DIAGNOSIS — Z9911 Dependence on respirator [ventilator] status: Secondary | ICD-10-CM | POA: Diagnosis not present

## 2016-12-30 DIAGNOSIS — I429 Cardiomyopathy, unspecified: Secondary | ICD-10-CM | POA: Diagnosis present

## 2016-12-30 DIAGNOSIS — I129 Hypertensive chronic kidney disease with stage 1 through stage 4 chronic kidney disease, or unspecified chronic kidney disease: Secondary | ICD-10-CM | POA: Diagnosis present

## 2016-12-30 DIAGNOSIS — Z87828 Personal history of other (healed) physical injury and trauma: Secondary | ICD-10-CM | POA: Diagnosis not present

## 2016-12-30 DIAGNOSIS — Z7982 Long term (current) use of aspirin: Secondary | ICD-10-CM

## 2016-12-30 DIAGNOSIS — J690 Pneumonitis due to inhalation of food and vomit: Secondary | ICD-10-CM | POA: Diagnosis present

## 2016-12-30 DIAGNOSIS — F17210 Nicotine dependence, cigarettes, uncomplicated: Secondary | ICD-10-CM | POA: Diagnosis present

## 2016-12-30 DIAGNOSIS — E872 Acidosis: Secondary | ICD-10-CM | POA: Diagnosis present

## 2016-12-30 DIAGNOSIS — G9340 Encephalopathy, unspecified: Secondary | ICD-10-CM | POA: Diagnosis not present

## 2016-12-30 DIAGNOSIS — R34 Anuria and oliguria: Secondary | ICD-10-CM | POA: Diagnosis present

## 2016-12-30 DIAGNOSIS — R1310 Dysphagia, unspecified: Secondary | ICD-10-CM | POA: Diagnosis not present

## 2016-12-30 DIAGNOSIS — J9601 Acute respiratory failure with hypoxia: Secondary | ICD-10-CM | POA: Diagnosis present

## 2016-12-30 DIAGNOSIS — G40901 Epilepsy, unspecified, not intractable, with status epilepticus: Secondary | ICD-10-CM | POA: Diagnosis present

## 2016-12-30 DIAGNOSIS — N189 Chronic kidney disease, unspecified: Secondary | ICD-10-CM | POA: Diagnosis present

## 2016-12-30 DIAGNOSIS — J9690 Respiratory failure, unspecified, unspecified whether with hypoxia or hypercapnia: Secondary | ICD-10-CM

## 2016-12-30 DIAGNOSIS — R6520 Severe sepsis without septic shock: Secondary | ICD-10-CM | POA: Diagnosis not present

## 2016-12-30 HISTORY — DX: Alcohol abuse, uncomplicated: F10.10

## 2016-12-30 LAB — COMPREHENSIVE METABOLIC PANEL
ALT: 10 U/L — ABNORMAL LOW (ref 17–63)
ANION GAP: 26 — AB (ref 5–15)
AST: 26 U/L (ref 15–41)
Albumin: 4.7 g/dL (ref 3.5–5.0)
Alkaline Phosphatase: 126 U/L (ref 38–126)
BILIRUBIN TOTAL: 0.6 mg/dL (ref 0.3–1.2)
BUN: 9 mg/dL (ref 6–20)
CO2: 13 mmol/L — ABNORMAL LOW (ref 22–32)
Calcium: 10.3 mg/dL (ref 8.9–10.3)
Chloride: 104 mmol/L (ref 101–111)
Creatinine, Ser: 2.07 mg/dL — ABNORMAL HIGH (ref 0.61–1.24)
GFR calc Af Amer: 38 mL/min — ABNORMAL LOW (ref >60)
GFR, EST NON AFRICAN AMERICAN: 33 mL/min — AB (ref >60)
Glucose, Bld: 209 mg/dL — ABNORMAL HIGH (ref 65–99)
POTASSIUM: 3.9 mmol/L (ref 3.5–5.1)
Sodium: 143 mmol/L (ref 135–145)
TOTAL PROTEIN: 8.5 g/dL — AB (ref 6.5–8.1)

## 2016-12-30 LAB — BLOOD GAS, ARTERIAL
ACID-BASE DEFICIT: 8.1 mmol/L — AB (ref 0.0–2.0)
Acid-base deficit: 6.4 mmol/L — ABNORMAL HIGH (ref 0.0–2.0)
Allens test (pass/fail): POSITIVE — AB
Allens test (pass/fail): POSITIVE — AB
BICARBONATE: 20 mmol/L (ref 20.0–28.0)
Bicarbonate: 20.6 mmol/L (ref 20.0–28.0)
FIO2: 1
FIO2: 1
FIO2: 1
LHR: 18 {breaths}/min
MECHVT: 500 mL
MECHVT: 500 mL
Mechanical Rate: 26
O2 Saturation: 85.5 %
O2 Saturation: 96.2 %
PATIENT TEMPERATURE: 37
PATIENT TEMPERATURE: 37
PATIENT TEMPERATURE: 37
PCO2 ART: 58 mmHg — AB (ref 32.0–48.0)
PCO2 ART: 46 mmHg (ref 32.0–48.0)
PEEP/CPAP: 10 cmH2O
PEEP: 10 cmH2O
PEEP: 8 cmH2O
PH ART: 7.21 — AB (ref 7.350–7.450)
PH ART: 7.26 — AB (ref 7.350–7.450)
PO2 ART: 99 mmHg (ref 83.0–108.0)
RATE: 22 resp/min
RATE: 26 resp/min
VT: 500 mL
pCO2 arterial: 50 mmHg — ABNORMAL HIGH (ref 32.0–48.0)
pH, Arterial: <6.9 — CL (ref 7.350–7.450)
pO2, Arterial: 62 mmHg — ABNORMAL LOW (ref 83.0–108.0)
pO2, Arterial: 95 mmHg (ref 83.0–108.0)

## 2016-12-30 LAB — CBC WITH DIFFERENTIAL/PLATELET
Basophils Absolute: 0.1 10*3/uL (ref 0–0.1)
Basophils Relative: 1 %
EOS PCT: 0 %
Eosinophils Absolute: 0.1 10*3/uL (ref 0–0.7)
HCT: 46 % (ref 40.0–52.0)
Hemoglobin: 13.9 g/dL (ref 13.0–18.0)
LYMPHS PCT: 22 %
Lymphs Abs: 2.7 10*3/uL (ref 1.0–3.6)
MCH: 29.6 pg (ref 26.0–34.0)
MCHC: 30.3 g/dL — ABNORMAL LOW (ref 32.0–36.0)
MCV: 97.8 fL (ref 80.0–100.0)
Monocytes Absolute: 0.6 10*3/uL (ref 0.2–1.0)
Monocytes Relative: 5 %
NEUTROS ABS: 8.8 10*3/uL — AB (ref 1.4–6.5)
Neutrophils Relative %: 72 %
PLATELETS: 277 10*3/uL (ref 150–440)
RBC: 4.7 MIL/uL (ref 4.40–5.90)
RDW: 16.4 % — ABNORMAL HIGH (ref 11.5–14.5)
WBC: 12.2 10*3/uL — AB (ref 3.8–10.6)

## 2016-12-30 LAB — URINE DRUG SCREEN, QUALITATIVE (ARMC ONLY)
AMPHETAMINES, UR SCREEN: NOT DETECTED
BARBITURATES, UR SCREEN: NOT DETECTED
BENZODIAZEPINE, UR SCRN: POSITIVE — AB
CANNABINOID 50 NG, UR ~~LOC~~: POSITIVE — AB
Cocaine Metabolite,Ur ~~LOC~~: NOT DETECTED
MDMA (Ecstasy)Ur Screen: NOT DETECTED
Methadone Scn, Ur: NOT DETECTED
Opiate, Ur Screen: NOT DETECTED
PHENCYCLIDINE (PCP) UR S: NOT DETECTED
Tricyclic, Ur Screen: NOT DETECTED

## 2016-12-30 LAB — URINALYSIS, COMPLETE (UACMP) WITH MICROSCOPIC
BILIRUBIN URINE: NEGATIVE
GLUCOSE, UA: 150 mg/dL — AB
HGB URINE DIPSTICK: NEGATIVE
KETONES UR: 5 mg/dL — AB
LEUKOCYTES UA: NEGATIVE
Nitrite: NEGATIVE
PROTEIN: 30 mg/dL — AB
Specific Gravity, Urine: 1.027 (ref 1.005–1.030)
pH: 5 (ref 5.0–8.0)

## 2016-12-30 LAB — PHOSPHORUS: Phosphorus: 5 mg/dL — ABNORMAL HIGH (ref 2.5–4.6)

## 2016-12-30 LAB — TROPONIN I
Troponin I: 0.03 ng/mL (ref <0.03)
Troponin I: 0.35 ng/mL (ref <0.03)

## 2016-12-30 LAB — BASIC METABOLIC PANEL
ANION GAP: 12 (ref 5–15)
BUN: 8 mg/dL (ref 6–20)
CALCIUM: 7.9 mg/dL — AB (ref 8.9–10.3)
CHLORIDE: 107 mmol/L (ref 101–111)
CO2: 20 mmol/L — AB (ref 22–32)
Creatinine, Ser: 1.92 mg/dL — ABNORMAL HIGH (ref 0.61–1.24)
GFR calc non Af Amer: 36 mL/min — ABNORMAL LOW (ref >60)
GFR, EST AFRICAN AMERICAN: 42 mL/min — AB (ref >60)
Glucose, Bld: 290 mg/dL — ABNORMAL HIGH (ref 65–99)
Potassium: 3.4 mmol/L — ABNORMAL LOW (ref 3.5–5.1)
Sodium: 139 mmol/L (ref 135–145)

## 2016-12-30 LAB — LIPASE, BLOOD: LIPASE: 26 U/L (ref 11–51)

## 2016-12-30 LAB — GLUCOSE, CAPILLARY
GLUCOSE-CAPILLARY: 124 mg/dL — AB (ref 65–99)
GLUCOSE-CAPILLARY: 224 mg/dL — AB (ref 65–99)
Glucose-Capillary: 298 mg/dL — ABNORMAL HIGH (ref 65–99)

## 2016-12-30 LAB — LACTIC ACID, PLASMA
Lactic Acid, Venous: 21.5 mmol/L (ref 0.5–1.9)
Lactic Acid, Venous: 5.4 mmol/L (ref 0.5–1.9)

## 2016-12-30 LAB — ACETAMINOPHEN LEVEL: Acetaminophen (Tylenol), Serum: <10 ug/mL — ABNORMAL LOW (ref 10–30)

## 2016-12-30 LAB — TRIGLYCERIDES: Triglycerides: 169 mg/dL — ABNORMAL HIGH (ref <150)

## 2016-12-30 LAB — ETHANOL: Alcohol, Ethyl (B): <5 mg/dL (ref <5)

## 2016-12-30 LAB — MAGNESIUM: Magnesium: 2.4 mg/dL (ref 1.7–2.4)

## 2016-12-30 LAB — TSH: TSH: 1.631 u[IU]/mL (ref 0.350–4.500)

## 2016-12-30 LAB — PROCALCITONIN: PROCALCITONIN: 0.6 ng/mL

## 2016-12-30 LAB — SALICYLATE LEVEL: Salicylate Lvl: <7 mg/dL (ref 2.8–30.0)

## 2016-12-30 MED ORDER — SODIUM BICARBONATE 8.4 % IV SOLN
100.0000 meq | Freq: Once | INTRAVENOUS | Status: AC
  Administered 2016-12-30: 100 meq via INTRAVENOUS
  Filled 2016-12-30: qty 100

## 2016-12-30 MED ORDER — FAMOTIDINE IN NACL 20-0.9 MG/50ML-% IV SOLN
20.0000 mg | INTRAVENOUS | Status: DC
  Administered 2016-12-30 – 2016-12-31 (×2): 20 mg via INTRAVENOUS
  Filled 2016-12-30 (×2): qty 50

## 2016-12-30 MED ORDER — LORAZEPAM 2 MG/ML IJ SOLN
2.0000 mg | Freq: Once | INTRAMUSCULAR | Status: AC
  Administered 2016-12-30: 2 mg via INTRAVENOUS

## 2016-12-30 MED ORDER — SODIUM CHLORIDE 0.9 % IV BOLUS (SEPSIS)
1000.0000 mL | Freq: Once | INTRAVENOUS | Status: AC
  Administered 2016-12-30: 1000 mL via INTRAVENOUS

## 2016-12-30 MED ORDER — SODIUM CHLORIDE 0.9 % IV SOLN: 250.0000 mL | INTRAVENOUS | Status: DC | PRN

## 2016-12-30 MED ORDER — PROPOFOL 1000 MG/100ML IV EMUL: 5.0000 ug/kg/min | INTRAVENOUS | Status: DC

## 2016-12-30 MED ORDER — LORAZEPAM 2 MG/ML IJ SOLN
1.0000 mg | INTRAMUSCULAR | Status: DC | PRN
  Administered 2016-12-31 (×2): 2 mg via INTRAVENOUS
  Administered 2017-01-01: 1 mg via INTRAVENOUS
  Administered 2017-01-01: 2 mg via INTRAVENOUS
  Filled 2016-12-30 (×4): qty 1

## 2016-12-30 MED ORDER — POTASSIUM CHLORIDE 10 MEQ/50ML IV SOLN
10.0000 meq | INTRAVENOUS | Status: AC
  Administered 2016-12-31 (×2): 10 meq via INTRAVENOUS
  Filled 2016-12-30 (×2): qty 50

## 2016-12-30 MED ORDER — AMIODARONE IV BOLUS ONLY 150 MG/100ML
150.0000 mg | Freq: Once | INTRAVENOUS | Status: AC
  Administered 2016-12-30: 150 mg via INTRAVENOUS

## 2016-12-30 MED ORDER — INSULIN ASPART 100 UNIT/ML ~~LOC~~ SOLN
0.0000 [IU] | SUBCUTANEOUS | Status: DC
  Administered 2016-12-30: 5 [IU] via SUBCUTANEOUS
  Administered 2016-12-31 (×2): 1 [IU] via SUBCUTANEOUS
  Administered 2016-12-31: 2 [IU] via SUBCUTANEOUS
  Administered 2016-12-31 – 2017-01-01 (×2): 1 [IU] via SUBCUTANEOUS
  Filled 2016-12-30 (×6): qty 1

## 2016-12-30 MED ORDER — AMIODARONE HCL IN DEXTROSE 360-4.14 MG/200ML-% IV SOLN
60.0000 mg/h | INTRAVENOUS | Status: AC
  Administered 2016-12-30: 60 mg/h via INTRAVENOUS

## 2016-12-30 MED ORDER — SODIUM CHLORIDE 0.9 % IV SOLN
1.0000 g | Freq: Two times a day (BID) | INTRAVENOUS | Status: DC
  Administered 2016-12-31 – 2017-01-02 (×5): 1 g via INTRAVENOUS
  Filled 2016-12-30 (×6): qty 1

## 2016-12-30 MED ORDER — IPRATROPIUM-ALBUTEROL 0.5-2.5 (3) MG/3ML IN SOLN: 3.0000 mL | RESPIRATORY_TRACT | Status: DC | PRN

## 2016-12-30 MED ORDER — SODIUM CHLORIDE 0.9 % IV SOLN
1.0000 g | INTRAVENOUS | Status: AC
  Administered 2016-12-30: 1 g via INTRAVENOUS
  Filled 2016-12-30: qty 1

## 2016-12-30 MED ORDER — SODIUM CHLORIDE 0.9 % IV SOLN
500.0000 mg | Freq: Two times a day (BID) | INTRAVENOUS | Status: DC
  Administered 2016-12-31 – 2017-01-02 (×5): 500 mg via INTRAVENOUS
  Filled 2016-12-30 (×6): qty 5

## 2016-12-30 MED ORDER — SODIUM BICARBONATE 8.4 % IV SOLN
INTRAVENOUS | Status: AC
  Administered 2016-12-30: 100 meq via INTRAVENOUS
  Filled 2016-12-30: qty 50

## 2016-12-30 MED ORDER — SODIUM CHLORIDE 0.9 % IV SOLN
1000.0000 mg | Freq: Once | INTRAVENOUS | Status: AC
  Administered 2016-12-30: 1000 mg via INTRAVENOUS
  Filled 2016-12-30: qty 10

## 2016-12-30 MED ORDER — FENTANYL CITRATE (PF) 100 MCG/2ML IJ SOLN
25.0000 ug | INTRAMUSCULAR | Status: DC | PRN
  Administered 2016-12-31 (×2): 50 ug via INTRAVENOUS
  Administered 2017-01-01: 100 ug via INTRAVENOUS
  Administered 2017-01-01 (×2): 50 ug via INTRAVENOUS
  Administered 2017-01-01: 100 ug via INTRAVENOUS
  Filled 2016-12-30 (×6): qty 2

## 2016-12-30 MED ORDER — SODIUM CHLORIDE 0.9 % IV SOLN: 1.0000 g | Freq: Two times a day (BID) | INTRAVENOUS | Status: DC

## 2016-12-30 MED ORDER — SODIUM CHLORIDE 0.9 % IV SOLN
1.0000 mg | Freq: Once | INTRAVENOUS | Status: AC
  Administered 2016-12-30: 1 mg via INTRAVENOUS
  Filled 2016-12-30: qty 0.2

## 2016-12-30 MED ORDER — LORAZEPAM 2 MG/ML IJ SOLN: 2.0000 mg | Freq: Once | INTRAMUSCULAR | Status: DC

## 2016-12-30 MED ORDER — METOPROLOL TARTRATE 5 MG/5ML IV SOLN: 2.5000 mg | INTRAVENOUS | Status: DC | PRN

## 2016-12-30 MED ORDER — AMIODARONE HCL IN DEXTROSE 360-4.14 MG/200ML-% IV SOLN
30.0000 mg/h | INTRAVENOUS | Status: DC
  Administered 2016-12-31: 30 mg/h via INTRAVENOUS
  Filled 2016-12-30: qty 200

## 2016-12-30 MED ORDER — HEPARIN SODIUM (PORCINE) 5000 UNIT/ML IJ SOLN
5000.0000 [IU] | Freq: Three times a day (TID) | INTRAMUSCULAR | Status: DC
  Administered 2016-12-30: 5000 [IU] via SUBCUTANEOUS
  Filled 2016-12-30: qty 1

## 2016-12-30 MED ORDER — ORAL CARE MOUTH RINSE
15.0000 mL | Freq: Four times a day (QID) | OROMUCOSAL | Status: DC
  Administered 2016-12-30 – 2016-12-31 (×2): 15 mL via OROMUCOSAL

## 2016-12-30 MED ORDER — ETOMIDATE 2 MG/ML IV SOLN
20.0000 mg | Freq: Once | INTRAVENOUS | Status: AC
  Administered 2016-12-30: 20 mg via INTRAVENOUS

## 2016-12-30 MED ORDER — PROPOFOL 1000 MG/100ML IV EMUL
5.0000 ug/kg/min | Freq: Once | INTRAVENOUS | Status: AC
  Administered 2016-12-30: 5 ug/kg/min via INTRAVENOUS

## 2016-12-30 MED ORDER — THIAMINE HCL 100 MG/ML IJ SOLN
100.0000 mg | Freq: Once | INTRAMUSCULAR | Status: AC
  Administered 2016-12-30: 100 mg via INTRAVENOUS
  Filled 2016-12-30: qty 2

## 2016-12-30 MED ORDER — VANCOMYCIN HCL IN DEXTROSE 1-5 GM/200ML-% IV SOLN
1000.0000 mg | Freq: Once | INTRAVENOUS | Status: AC
  Administered 2016-12-30: 1000 mg via INTRAVENOUS
  Filled 2016-12-30: qty 200

## 2016-12-30 MED ORDER — LORAZEPAM 2 MG/ML IJ SOLN
INTRAMUSCULAR | Status: AC
  Administered 2016-12-30: 2 mg via INTRAVENOUS
  Filled 2016-12-30: qty 1

## 2016-12-30 MED ORDER — CHLORHEXIDINE GLUCONATE 0.12% ORAL RINSE (MEDLINE KIT)
15.0000 mL | Freq: Two times a day (BID) | OROMUCOSAL | Status: DC
  Administered 2016-12-30 – 2016-12-31 (×2): 15 mL via OROMUCOSAL

## 2016-12-30 MED ORDER — LORAZEPAM 2 MG/ML IJ SOLN
INTRAMUSCULAR | Status: AC
  Administered 2016-12-30: 1 mg
  Filled 2016-12-30: qty 1

## 2016-12-30 MED ORDER — ROCURONIUM BROMIDE 50 MG/5ML IV SOLN
100.0000 mg | Freq: Once | INTRAVENOUS | Status: AC
  Administered 2016-12-30: 100 mg via INTRAVENOUS
  Filled 2016-12-30: qty 10

## 2016-12-30 MED ORDER — NOREPINEPHRINE BITARTRATE 1 MG/ML IV SOLN
0.0000 ug/min | INTRAVENOUS | Status: DC
  Administered 2016-12-30: 4 ug/min via INTRAVENOUS
  Filled 2016-12-30: qty 4

## 2016-12-30 MED ORDER — IPRATROPIUM-ALBUTEROL 0.5-2.5 (3) MG/3ML IN SOLN
3.0000 mL | Freq: Four times a day (QID) | RESPIRATORY_TRACT | Status: DC
  Administered 2016-12-30 – 2017-01-02 (×11): 3 mL via RESPIRATORY_TRACT
  Filled 2016-12-30 (×11): qty 3

## 2016-12-30 MED ORDER — AMIODARONE IV BOLUS ONLY 150 MG/100ML
INTRAVENOUS | Status: AC
  Administered 2016-12-30: 150 mg via INTRAVENOUS
  Filled 2016-12-30: qty 100

## 2016-12-30 MED ORDER — AMIODARONE LOAD VIA INFUSION
150.0000 mg | Freq: Once | INTRAVENOUS | Status: DC
  Filled 2016-12-30: qty 83.34

## 2016-12-30 MED ORDER — SODIUM CHLORIDE 0.9 % IV BOLUS (SEPSIS)
500.0000 mL | Freq: Once | INTRAVENOUS | Status: AC
  Administered 2016-12-30: 500 mL via INTRAVENOUS

## 2016-12-30 MED ORDER — AMIODARONE HCL IN DEXTROSE 360-4.14 MG/200ML-% IV SOLN
INTRAVENOUS | Status: AC
  Administered 2016-12-30: 60 mg/h via INTRAVENOUS
  Filled 2016-12-30: qty 200

## 2016-12-30 MED ORDER — STERILE WATER FOR INJECTION IV SOLN
INTRAVENOUS | Status: DC
  Administered 2016-12-30: 20:00:00 via INTRAVENOUS
  Filled 2016-12-30 (×4): qty 850

## 2016-12-30 NOTE — ED Notes (Signed)
RT to bedside to suction pt at this time.

## 2016-12-30 NOTE — Progress Notes (Signed)
eLink Physician-Brief Progress Note Patient Name: Patrick MallStephen G Patton DOB: 10/02/1955 MRN: [id number]   Date of Service  12/30/2016  HPI/Events of Note  Status epilepticus associated lactic acid value initially 21, now 5; happened to receive IV fluids for possible sepsis despite the patient never being in shock.  eICU Interventions  Hold off of further fluid resuscitation as the patient is not in shock and lactic acid elevation was likely due to seizures     Intervention Category Intermediate Interventions: Diagnostic test evaluation  Max FickleDouglas McQuaid 12/30/2016, 9:18 PM

## 2016-12-30 NOTE — ED Notes (Signed)
Pharmacy notified to send Amniodarone bolus.

## 2016-12-30 NOTE — ED Notes (Signed)
CODE  SEPSIS  CALLED  TO  CAREL[facility] 

## 2016-12-30 NOTE — Progress Notes (Signed)
eLink Physician-Brief Progress Note Patient Name: Patrick MallStephen G Patton DOB: 05/21/1956 MRN: [id number]   Date of Service  12/30/2016  HPI/Events of Note  Case discussed with Patrick Patton, patient [facility]  eICU Interventions  [facility]     Intervention Category Minor Interventions: Routine modifications to care plan (e.g. PRN medications for pain, fever)  Patrick Patton 12/30/2016, 6:08 PM

## 2016-12-30 NOTE — ED Notes (Signed)
Patient transported to CT via float RN and RT at this time.

## 2016-12-30 NOTE — Progress Notes (Signed)
Pharmacy Antibiotic Note  Patrick Patton is a 61 y.o. male admitted on 12/30/2016 with ?aspiration pneumonia/Witnessed aspiration during intubation. Patrick Patton.  Pharmacy has been consulted for Meropenem dosing.  PCN allergy. Admitted w/ seizure (hx of seizures) Received Vancomycin x 1 dose in ER  Plan: Will begin Meropenem 1 gram IV q12h for Crcl 31 ml/min. (Patient was [facility] but not stable enough).  Discussed with CCU charge RN to monitor patient for possible reaction (d/t PCN allergy)   Height: 5\' 4"  (162.6 cm) Weight: 132 lb 0.9 oz (59.9 kg) IBW/kg (Calculated) : 59.2  Temp (24hrs), Avg:100 F (37.8 C), Min:100 F (37.8 C), Max:100 F (37.8 C)   Recent Labs Lab 12/30/16 1553 12/30/16 1604 12/30/16 1935  WBC 12.2*  --   --   CREATININE 2.07*  --   --   LATICACIDVEN  --  21.5* 5.4*    Estimated Creatinine Clearance: 31.4 mL/min (A) (by C-G formula based on SCr of 2.07 mg/dL (H)).    Allergies  Allergen Reactions  . Penicillins Anaphylaxis and Swelling    .Has patient had a PCN reaction causing immediate rash, facial/tongue/throat swelling, SOB or lightheadedness with hypotension: Unknown Has patient had a PCN reaction causing severe rash involving mucus membranes or skin necrosis: No Has patient had a PCN reaction that required hospitalization: No Has patient had a PCN reaction occurring within the last 10 years: No If all of the above answers are "NO", then may proceed with Cephalosporin use.   . Codeine Hives    Antimicrobials this admission: Vanc x 1 6/18 >>   Meropenem  6/18 >>    Dose adjustments this admission:    Microbiology results: 6/18 BCx: P 6/18 UCx: P  6/18 TRach aspirate P    MRSA PCR:    Thank you for allowing pharmacy to be a part of this patient's care.  [name] A 12/30/2016 9:03 PM
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
## 2016-12-30 NOTE — Progress Notes (Signed)
  Amiodarone Drug - Drug Interaction Consult Note  Recommendations: No changes currently recommended  Amiodarone is metabolized by the cytochrome P450 system and therefore has the potential to cause many drug interactions. Amiodarone has an average plasma half-life of 50 days (range 20 to 100 days).   There is potential for drug interactions to occur several weeks or months after stopping treatment and the onset of drug interactions may be slow after initiating amiodarone.   []  Statins: Increased risk of myopathy. Simvastatin- restrict dose to 20mg  daily. Other statins: counsel patients to report any muscle pain or weakness immediately.  []  Anticoagulants: Amiodarone can increase anticoagulant effect. Consider warfarin dose reduction. Patients should be monitored closely and the dose of anticoagulant altered accordingly, remembering that amiodarone levels take several weeks to stabilize.  []  Antiepileptics: Amiodarone can increase plasma concentration of phenytoin, the dose should be reduced. Note that small changes in phenytoin dose can result in large changes in levels. Monitor patient and counsel on signs of toxicity.  []  Beta blockers: increased risk of bradycardia, AV block and myocardial depression. Sotalol - avoid concomitant use.  []   Calcium channel blockers (diltiazem and verapamil): increased risk of bradycardia, AV block and myocardial depression.  []   Cyclosporine: Amiodarone increases levels of cyclosporine. Reduced dose of cyclosporine is recommended.  []  Digoxin dose should be halved when amiodarone is started.  []  Diuretics: increased risk of cardiotoxicity if hypokalemia occurs.  []  Oral hypoglycemic agents (glyburide, glipizide, glimepiride): increased risk of hypoglycemia. Patient's glucose levels should be monitored closely when initiating amiodarone therapy.   []  Drugs that prolong the QT interval:  Torsades de pointes risk may be increased with concurrent use -  avoid if possible.  Monitor QTc, also keep magnesium/potassium WNL if concurrent therapy can't be avoided. Marland Kitchen. Antibiotics: e.g. fluoroquinolones, erythromycin. . Antiarrhythmics: e.g. quinidine, procainamide, disopyramide, sotalol. . Antipsychotics: e.g. phenothiazines, haloperidol.  . Lithium, tricyclic antidepressants, and methadone.  Thank You,  Patrick Patton A  12/30/2016 6:02 PM

## 2016-12-30 NOTE — ED Notes (Signed)
Pt 87% on ventilater at 100%.  Pt repositioned.  RT notified.  MD notified.

## 2016-12-30 NOTE — ED Provider Notes (Addendum)
[facility] Emergency Department Provider Note  ____________________________________________   I have reviewed the triage vital signs and the nursing notes.   HISTORY  Chief Complaint Seizures    HPI Patrick Patton is a 61 y.o. male who presents today after seizure. History is very limited. Level 5 chart caveat; no further history available due to patient status.According to EMS patient was found outside and confused, he was able to elevate up to his apartment where he had a witnessed grand mal seizure by police. This was brief. EMS got there and found to have pinpoint pupils and gave him Narcan. Shortly thereafter he had another grand mal seizure. Patient is known to have a seizure disorder according to notes. He broke on his own and then arrived here with an inability to give any history. He is postictal in appearance. He is breathing rapidly. Blood glucoses were in the reassuring range. We rechecked it here. Patient then had a grand mal seizure in front of the started with a right gaze preference and then generalized. This lasted for less than 2 minutes we did give Ativan and the seizure broke again. He had another grand mal seizure while in the room and was given again to Ativan again the seizure lasted less than 2 minutes however he was not back to baseline in between seizures.  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 Unknown if he has been off his Xanax. Core temperature is 100. Patient cannot give any further history  Past Medical History:  Diagnosis Date  . Seizures (HCC)    X10 years ago    Patient Active Problem List   Diagnosis Date Noted  . Non-ST elevated myocardial infarction [facility]) 05/31/2016    Past Surgical History:  Procedure Laterality Date  . pin in arm    . SMALL INTESTINE SURGERY    . spleen removal      Prior to Admission medications   Medication Sig Start Date End Date Taking?  Authorizing Provider  ALPRAZolam Prudy Feeler) 0.5 MG tablet Take 1 tablet (0.5 mg total) by mouth 2 (two) times daily. 06/04/16   Ramonita Lab, MD  aspirin EC 81 MG EC tablet Take 1 tablet (81 mg total) by mouth daily. 06/05/16   Ramonita Lab, MD  cholecalciferol (VITAMIN D) 1000 units tablet Take 2,000 Units by mouth daily.    [provider]  feeding supplement, ENSURE ENLIVE, (ENSURE ENLIVE) LIQD Take 237 mLs by mouth 3 (three) times daily. 06/04/16   Ramonita Lab, MD  Multiple Vitamin (MULTIVITAMIN WITH MINERALS) TABS tablet Take 1 tablet by mouth daily. 06/05/16   Ramonita Lab, MD  Oxycodone HCl 10 MG TABS Take 1 tablet (10 mg total) by mouth 4 (four) times daily as needed. 06/04/16   Gouru, Deanna Artis, MD  pantoprazole (PROTONIX) 40 MG tablet Take 1 tablet (40 mg total) by mouth 2 (two) times daily. 06/04/16   Gouru, Deanna Artis, MD  rosuvastatin (CRESTOR) 20 MG tablet Take 1 tablet (20 mg total) by mouth daily. 06/05/16   Ramonita Lab, MD  thiamine 100 MG tablet Take 1 tablet (100 mg total) by mouth daily. 06/05/16   Ramonita Lab, MD    Allergies Penicillins and Codeine  No family history on file.  Social History Social History  Substance Use Topics  . Smoking status: Current Every Day Smoker    Packs/day: 1.00    Types: Cigarettes  . Smokeless tobacco: Current User    Types: Chew,  Snuff  . Alcohol use No    Review of Systems Level 5 chart caveat; no further history available due to patient status.  ____________________________________________   PHYSICAL EXAM:  VITAL SIGNS: ED Triage Vitals  Enc Vitals Group     BP 12/30/16 1559 (!) 184/125     Pulse Rate 12/30/16 1559 (!) 146     Resp 12/30/16 1559 (!) 36     Temp 12/30/16 1559 100 F (37.8 C)     Temp Source 12/30/16 1559 Rectal     SpO2 12/30/16 1559 (!) 87 %     Weight 12/30/16 1600 131 lb (59.4 kg)     Height 12/30/16 1600 5\' 6"  (1.676 m)     Head Circumference --      [place] Flow --      Pain Score --      Pain  Loc --      Pain Edu? --      Excl. in GC? --     Constitutional:Somnolent, non-responsive eyes open nonfocal with limited exam Eyes: Conjunctivae are normal Head: Atraumatic HEENT: No congestion/rhinnorhea. Mucous membranes are moist.  Oropharynx non-erythematous Neck:   Nontender with no meningismus, no masses, no stridor Cardiovascular: Ectocardia noted, regular rhythm. Grossly normal heart sounds.  Good peripheral circulation. Respiratory: Increased respirations noted.  No retractions. Lungs diminished and rhonchorous. Abdominal: Soft and nontender. No distention. No guarding no rebound Back: there are no lesions noted. there is no CVA tenderness GU: Normal Musculoskeletal:  No joint effusions, no DVT signs strong distal pulses no edema Neurologic:  Nonfocal with limited exam, flaccid, pupils reactive but minimally so Skin:  Skin is warm, dry and intact. No rash noted. Psychiatric: Mood and affect are normal. Speech and behavior are normal.  ____________________________________________   LABS (all labs ordered are listed, but only abnormal results are displayed)  Labs Reviewed  CBC WITH DIFFERENTIAL/PLATELET - Abnormal; Notable for the following:       Result Value   WBC 12.2 (*)    MCHC 30.3 (*)    RDW 16.4 (*)    Neutro Abs 8.8 (*)    All other components within normal limits  GLUCOSE, CAPILLARY - Abnormal; Notable for the following:    Glucose-Capillary 224 (*)    All other components within normal limits  CULTURE, BLOOD (ROUTINE X 2)  CULTURE, BLOOD (ROUTINE X 2)  COMPREHENSIVE METABOLIC PANEL  URINALYSIS, COMPLETE (UACMP) WITH MICROSCOPIC  URINE DRUG SCREEN, QUALITATIVE ([facility] ONLY)  LIPASE, BLOOD  ETHANOL  SALICYLATE LEVEL  TROPONIN I  LACTIC ACID, PLASMA  LACTIC ACID, PLASMA  ACETAMINOPHEN LEVEL  BLOOD GAS, ARTERIAL  CBG MONITORING, ED   ____________________________________________  EKG  I personally interpreted any EKGs ordered by me or triage 2  EKGs were performed, the first is 149, possibly sinus tach unclear baseline rhythm, intraventricular conduction delay noted, artifact limits exam. LAD noted. Nonspecific ST changes, Repeat EKG shows a regular rate which could be a flutter or sinus QRS is 111 QTC 572 normal axis, nonspecific ST changes including ST depression laterally ____________________________________________  RADIOLOGY  I reviewed any imaging ordered by me or triage that were performed during my shift and, if possible, patient and/or family made aware of any abnormal findings. ____________________________________________   PROCEDURES  Procedure(s) performed: None  Procedures  Critical Care performed: CRITICAL CARE Performed by: Jeanmarie PlantJAMES A Harjot Dibello   Total critical care time: 75 minutes  Critical care time was exclusive of separately billable procedures and treating other patients.  Critical care was necessary to treat or prevent imminent or life-threatening deterioration.  Critical care was time spent personally by me on the following activities: development of treatment plan with patient and/or surrogate as well as nursing, discussions with consultants, evaluation of patient's response to treatment, examination of patient, obtaining history from patient or surrogate, ordering and performing treatments and interventions, ordering and review of laboratory studies, ordering and review of radiographic studies, pulse oximetry and re-evaluation of patient's condition.   ____________________________________________   INITIAL IMPRESSION / ASSESSMENT AND PLAN / ED COURSE  Pertinent labs & imaging results that were available during my care of the patient were reviewed by me and considered in my medical decision making (see chart for details).  ----------------------------------------- 4:15 PM on 12/30/2016 -----------------------------------------  Talked to Dr. Thad Ranger of neurology Who agrees w Michaelle Copas and suggests pt  [facility] need [facility] she agrees with management thus far. We have given Keppra and continued to give Ativan and she agrees with this management  ----------------------------------------- 4:57 PM on 12/30/2016 -----------------------------------------  Discussed with Dr. Darrol Angel of the ICU who agrees patient is not stable for transfer at this time. He agrees with antibiotics and bicarbonate drip which he states he will order. Patient severely acidotic, we will start empiric antibiotics. I did discuss with eICU they do recommended meropenem and vancomycin. I discussed with the pharmacist they feel that there is no significant past reactive to meropenem and was sewn it is safe to give. They will dose it. We'll start vancomycin. Bicarbonate drip will be ordered by the attending of the ICU, at his preference, he states he will be here any moment. The patient remains intubated. We do not the patient is likely in a flutter at 155 with a strong pressure. ICU doctor wished me to see the patient before dressing that and he will be here probably.  Patient seizure has been addressed with Keppra here as well as multiple benzos for the possibility of withdrawal. Overdose is also possible although not known. Patient on propofol drip. I'll give him 2 A of bicarbonate prior to arrival ICU physician, electrolytes are reassuring kidney function is near baseline CT head is negative, there was no known trauma but I did a CT C-spine as a precaution which is reassuring. Patient profoundly acidotic exact etiology of this is not yet clear. Giving him thiamine and folate. Patient is admitted to the SCU service. He is not stable for transport his sats are 89% on 100% FiO2 and now   ----------------------------------------- 6:01 PM on 12/30/2016 ----------------------------------------- D/w his sister and next of kin, Ms Beverely Pace, who says he looked pretty good when last he was seen by her 2 days. No hx of etoh  abuse. ? Drug abuse.  + hx of drug abuse. No further hx avail she will come see him in the icu.  ____________________________________________   FINAL CLINICAL IMPRESSION(S) / ED DIAGNOSES  Final diagnoses:  Seizures (HCC)      This chart was dictated using voice recognition software.  Despite best efforts to proofread,  errors can occur which can change meaning.      Jeanmarie Plant, MD 12/30/16 1706    Jeanmarie Plant, MD 12/30/16 [phone]

## 2016-12-30 NOTE — H&P (Signed)
PULMONARY / CRITICAL CARE MEDICINE   Name: Patrick Patton MRN: [id number] DOB: [birth year]    ADMISSION DATE:  12/30/2016  PT PROFILE:  20 M with seizure disorder brought to ED by EMS with status epilepticus. Intubated in ED. Witnessed aspiration during intubation. Severe metabolic acidosis, AKI, tachyarrhythmias all noted in the emergency department.  MAJOR EVENTS/TEST RESULTS: 06/18 admitted with status epilepticus as noted above 06/18 CT head: Tiny lacunar infarct mid right cerebellum, stable. No acute infarct noted. 06/18 CT C-spine: No abnormalities noted  INDWELLING DEVICES:: ETT 06/18 >>  R [place] CVL 06/18 >>   MICRO DATA: MRSA PCR 06/18 >>  Urine 06/18 >>  Resp 06/18 >>  Blood 06/18 >>   ANTIMICROBIALS:  Meropenem 06/18 >>    HISTORY OF PRESENT ILLNESS:   61 y.o. M with demented PMH of seizure disorder and coronary artery disease. He lives independently at baseline. He was found behaving unusually by his neighbor who dispatched EMS. Upon their arrival he was found to be convulsing. He received lorazepam with transient resolution and he was transported to the emergency department at [facility] where he progressed to status epilepticus and received 2 more doses of lorazepam. At that point, he had worsening hypoxemia and underwent intubation. During the intubation, he was noted to have vomitus in his oropharynx.  PAST MEDICAL HISTORY :  He  has a past medical history of Seizures (HCC).  PAST SURGICAL HISTORY: He  has a past surgical history that includes pin in arm; spleen removal; and Small intestine surgery.  Allergies  Allergen Reactions  . Penicillins Anaphylaxis and Swelling    .Has patient had a PCN reaction causing immediate rash, facial/tongue/throat swelling, SOB or lightheadedness with hypotension: Unknown Has patient had a PCN reaction causing severe rash involving mucus membranes or skin necrosis: No Has patient had a PCN reaction that required  hospitalization: No Has patient had a PCN reaction occurring within the last 10 years: No If all of the above answers are "NO", then may proceed with Cephalosporin use.   . Codeine Hives    No current facility-administered medications on file prior to encounter.    Current Outpatient Prescriptions on File Prior to Encounter  Medication Sig  . ALPRAZolam (XANAX) 0.5 MG tablet Take 1 tablet (0.5 mg total) by mouth 2 (two) times daily.  Marland Kitchen aspirin EC 81 MG EC tablet Take 1 tablet (81 mg total) by mouth daily.  . cholecalciferol (VITAMIN D) 1000 units tablet Take 2,000 Units by mouth daily.  . Multiple Vitamin (MULTIVITAMIN WITH MINERALS) TABS tablet Take 1 tablet by mouth daily.  Marland Kitchen thiamine 100 MG tablet Take 1 tablet (100 mg total) by mouth daily.  . feeding supplement, ENSURE ENLIVE, (ENSURE ENLIVE) LIQD Take 237 mLs by mouth 3 (three) times daily.  . Oxycodone HCl 10 MG TABS Take 1 tablet (10 mg total) by mouth 4 (four) times daily as needed. (Patient not taking: Reported on 12/30/2016)  . pantoprazole (PROTONIX) 40 MG tablet Take 1 tablet (40 mg total) by mouth 2 (two) times daily. (Patient not taking: Reported on 12/30/2016)  . rosuvastatin (CRESTOR) 20 MG tablet Take 1 tablet (20 mg total) by mouth daily. (Patient not taking: Reported on 12/30/2016)    FAMILY HISTORY:  His has no family status information on file.    SOCIAL HISTORY: He  reports that he has been smoking Cigarettes.  He has been smoking about 1.00 pack per day. His smokeless tobacco use includes Chew and Snuff.  He reports that he does not drink alcohol.  REVIEW OF SYSTEMS:   Level V caveat  SUBJECTIVE:    VITAL SIGNS: BP (!) 161/126   Pulse (!) 154   Temp 100 F (37.8 C) (Rectal)   Resp (!) 22   Ht 5\' 6"  (1.676 m)   Wt 131 lb (59.4 kg)   SpO2 90%   BMI 21.14 kg/m   HEMODYNAMICS:    VENTILATOR SETTINGS: Vent Mode: PRVC FiO2 (%):  [100 %] 100 % Set Rate:  [22 bmp] 22 bmp Vt Set:  [500 mL] 500  mL PEEP:  [10 cmH20] 10 cmH20  INTAKE / OUTPUT: No intake/output data recorded.  PHYSICAL EXAMINATION: General: RASS -5, not F/C Neuro: mild anisocoria, pupils react, Doll's eyes intact, no spontaneous movement HEENT: NCAT, sclerae white Cardiovascular: Tachycardic, regular, no murmurs noted, distal pulses full Lungs: Barrel chested, breath sounds full, no wheezes or other adventitious sounds Abdomen: Midline scar, soft, diminished bowel sounds, no masses Extremities: Warm, no edema Skin: No lesions noted  LABS:  BMET  Recent Labs Lab 12/30/16 1553  NA 143  K 3.9  CL 104  CO2 13*  BUN 9  CREATININE 2.07*  GLUCOSE 209*    Electrolytes  Recent Labs Lab 12/30/16 1553  CALCIUM 10.3  MG 2.4  PHOS 5.0*    CBC  Recent Labs Lab 12/30/16 1553  WBC 12.2*  HGB 13.9  HCT 46.0  PLT 277    Coag's No results for input(s): APTT, INR in the last 168 hours.  Sepsis Markers  Recent Labs Lab 12/30/16 1604  LATICACIDVEN 21.5*    ABG  Recent Labs Lab 12/30/16 1637  PHART <6.900*  PCO2ART 58*  PO2ART 99    Liver Enzymes  Recent Labs Lab 12/30/16 1553  AST 26  ALT 10*  ALKPHOS 126  BILITOT 0.6  ALBUMIN 4.7    Cardiac Enzymes  Recent Labs Lab 12/30/16 1553  TROPONINI 0.03*    Glucose  Recent Labs Lab 12/30/16 1547  GLUCAP 224*    CXR: Vague bilateral interstitial opacities   ASSESSMENT / PLAN:  PULMONARY A: Acute hypoxemic respiratory failure Suspected aspiration pneumonitis P:   Vent settings established Vent bundle implemented Daily SBT as indicated Empiric nebulized bronchodilators  CARDIOVASCULAR A:  Hypertension Tachycardia - exact rhythm unclear. Suspect 2:1 flutter P:  MAP goal > 65 mmHg Amiodarone infusion ordered When necessary metoprolol to maintain heart rate less than 120/min  RENAL A:   AKI CKD - baseline creatinine appears to be 1.7 range Severe metabolic acidosis - lactic acidosis due to status  epilepticus P:   Monitor BMET intermittently Monitor I/Os Correct electrolytes as indicated HCO3 infusion initiated Monitor chemistry panel and ABGs frequently  GASTROINTESTINAL A:   No issues identified P:   SUP: IV famotidine Consider nutririon 06/19  HEMATOLOGIC A:   No issues identified P:  DVT px: SQ heparin Monitor CBC intermittently Transfuse per usual guidelines   INFECTIOUS A:   Suspected aspiration pneumonia P:   Monitor temp, WBC count Micro and abx as above   ENDOCRINE A:   Stress-induced hyperglycemia - no documented history of diabetes P:   Sensitive scale SSI  NEUROLOGIC A:   Seizure disorder Status epilepticus Acute encephalopathy P:   RASS goal: -2, -3 Propofol infusion Keppra ordered Neurology consultation requested   FAMILY: No family has been identified so no update could be provided   CCM time: 45 min The above time includes time spent in consultation with patient  and/or family members and reviewing care plan on multidisciplinary rounds  Billy Fischer, MD PCCM service Mobile [phone] Pager [phone] 12/30/2016, 6:05 PM

## 2016-12-31 ENCOUNTER — Inpatient Hospital Stay (HOSPITAL_COMMUNITY)
Admit: 2016-12-31 | Discharge: 2016-12-31 | Disposition: A | Discharge status: Home / Self Care | Payer: Medicare Other | Attending: Pulmonary Disease | Admitting: Pulmonary Disease

## 2016-12-31 ENCOUNTER — Inpatient Hospital Stay: Payer: Medicare Other

## 2016-12-31 DIAGNOSIS — I360 Nonrheumatic tricuspid (valve) stenosis: Secondary | ICD-10-CM

## 2016-12-31 DIAGNOSIS — Z9911 Dependence on respirator [ventilator] status: Secondary | ICD-10-CM

## 2016-12-31 DIAGNOSIS — A419 Sepsis, unspecified organism: Principal | ICD-10-CM

## 2016-12-31 DIAGNOSIS — G9340 Encephalopathy, unspecified: Secondary | ICD-10-CM

## 2016-12-31 DIAGNOSIS — R6521 Severe sepsis with septic shock: Secondary | ICD-10-CM

## 2016-12-31 DIAGNOSIS — J690 Pneumonitis due to inhalation of food and vomit: Secondary | ICD-10-CM

## 2016-12-31 LAB — COMPREHENSIVE METABOLIC PANEL
ALBUMIN: 2.6 g/dL — AB (ref 3.5–5.0)
ALK PHOS: 75 U/L (ref 38–126)
ALT: 8 U/L — ABNORMAL LOW (ref 17–63)
ANION GAP: 7 (ref 5–15)
AST: 26 U/L (ref 15–41)
BILIRUBIN TOTAL: 0.7 mg/dL (ref 0.3–1.2)
BUN: 10 mg/dL (ref 6–20)
CALCIUM: 6.8 mg/dL — AB (ref 8.9–10.3)
CO2: 24 mmol/L (ref 22–32)
Chloride: 108 mmol/L (ref 101–111)
Creatinine, Ser: 2.07 mg/dL — ABNORMAL HIGH (ref 0.61–1.24)
GFR, EST AFRICAN AMERICAN: 38 mL/min — AB (ref >60)
GFR, EST NON AFRICAN AMERICAN: 33 mL/min — AB (ref >60)
GLUCOSE: 98 mg/dL (ref 65–99)
POTASSIUM: 3.7 mmol/L (ref 3.5–5.1)
Sodium: 139 mmol/L (ref 135–145)
TOTAL PROTEIN: 4.9 g/dL — AB (ref 6.5–8.1)

## 2016-12-31 LAB — BLOOD GAS, ARTERIAL
ACID-BASE DEFICIT: 3 mmol/L — AB (ref 0.0–2.0)
Bicarbonate: 22.7 mmol/L (ref 20.0–28.0)
FIO2: 1
LHR: 26 {breaths}/min
O2 Saturation: 99.5 %
PEEP: 8 cmH2O
PH ART: 7.34 — AB (ref 7.350–7.450)
Patient temperature: 37
VT: 500 mL
pCO2 arterial: 42 mmHg (ref 32.0–48.0)
pO2, Arterial: 181 mmHg — ABNORMAL HIGH (ref 83.0–108.0)

## 2016-12-31 LAB — TRIGLYCERIDES: Triglycerides: 104 mg/dL (ref <150)

## 2016-12-31 LAB — CBC
HEMATOCRIT: 37.2 % — AB (ref 40.0–52.0)
HEMOGLOBIN: 12.2 g/dL — AB (ref 13.0–18.0)
MCH: 29.3 pg (ref 26.0–34.0)
MCHC: 32.8 g/dL (ref 32.0–36.0)
MCV: 89.1 fL (ref 80.0–100.0)
Platelets: 252 10*3/uL (ref 150–440)
RBC: 4.18 MIL/uL — ABNORMAL LOW (ref 4.40–5.90)
RDW: 15.4 % — AB (ref 11.5–14.5)
WBC: 18.5 10*3/uL — ABNORMAL HIGH (ref 3.8–10.6)

## 2016-12-31 LAB — HEPARIN LEVEL (UNFRACTIONATED)
HEPARIN UNFRACTIONATED: 0.41 [IU]/mL (ref 0.30–0.70)
HEPARIN UNFRACTIONATED: 0.33 [IU]/mL (ref 0.30–0.70)
Heparin Unfractionated: 0.2 [IU]/mL — ABNORMAL LOW (ref 0.30–0.70)

## 2016-12-31 LAB — GLUCOSE, CAPILLARY
GLUCOSE-CAPILLARY: 117 mg/dL — AB (ref 65–99)
GLUCOSE-CAPILLARY: 143 mg/dL — AB (ref 65–99)
GLUCOSE-CAPILLARY: 121 mg/dL — AB (ref 65–99)
Glucose-Capillary: 88 mg/dL (ref 65–99)
Glucose-Capillary: 153 mg/dL — ABNORMAL HIGH (ref 65–99)

## 2016-12-31 LAB — PROTIME-INR
INR: 1.38
PROTHROMBIN TIME: 17.1 s — AB (ref 11.4–15.2)

## 2016-12-31 LAB — APTT: APTT: 37 s — AB (ref 24–36)

## 2016-12-31 LAB — CBC WITH DIFFERENTIAL/PLATELET
BASOS ABS: 0 10*3/uL (ref 0–0.1)
Basophils Relative: 0 %
Eosinophils Absolute: 0 10*3/uL (ref 0–0.7)
Eosinophils Relative: 0 %
HEMATOCRIT: 34.6 % — AB (ref 40.0–52.0)
HEMOGLOBIN: 11.4 g/dL — AB (ref 13.0–18.0)
LYMPHS PCT: 6 %
Lymphs Abs: 1 10*3/uL (ref 1.0–3.6)
MCH: 29.3 pg (ref 26.0–34.0)
MCHC: 32.9 g/dL (ref 32.0–36.0)
MCV: 89.1 fL (ref 80.0–100.0)
MONO ABS: 1.4 10*3/uL — AB (ref 0.2–1.0)
Monocytes Relative: 8 %
NEUTROS PCT: 86 %
Neutro Abs: 15.5 10*3/uL — ABNORMAL HIGH (ref 1.4–6.5)
Platelets: 214 10*3/uL (ref 150–440)
RBC: 3.89 MIL/uL — ABNORMAL LOW (ref 4.40–5.90)
RDW: 15.3 % — AB (ref 11.5–14.5)
WBC: 18 10*3/uL — ABNORMAL HIGH (ref 3.8–10.6)

## 2016-12-31 LAB — MAGNESIUM: Magnesium: 1.6 mg/dL — ABNORMAL LOW (ref 1.7–2.4)

## 2016-12-31 LAB — PROCALCITONIN: Procalcitonin: 6.48 ng/mL

## 2016-12-31 LAB — TROPONIN I
TROPONIN I: 1.4 ng/mL — AB (ref <0.03)
Troponin I: 1.2 ng/mL (ref <0.03)

## 2016-12-31 LAB — MRSA PCR SCREENING: MRSA BY PCR: NEGATIVE

## 2016-12-31 MED ORDER — HEPARIN (PORCINE) IN NACL 100-0.45 UNIT/ML-% IJ SOLN
1050.0000 [IU]/h | INTRAMUSCULAR | Status: DC
  Administered 2016-12-31: 750 [IU]/h via INTRAVENOUS
  Administered 2017-01-01: 950 [IU]/h via INTRAVENOUS
  Filled 2016-12-31 (×2): qty 250

## 2016-12-31 MED ORDER — ORAL CARE MOUTH RINSE
15.0000 mL | OROMUCOSAL | Status: DC
  Administered 2016-12-31 – 2017-01-01 (×8): 15 mL via OROMUCOSAL

## 2016-12-31 MED ORDER — SODIUM CHLORIDE 0.9% FLUSH: 10.0000 mL | INTRAVENOUS | Status: DC | PRN

## 2016-12-31 MED ORDER — VITAL HIGH PROTEIN PO LIQD
1000.0000 mL | ORAL | Status: DC
  Administered 2016-12-31 – 2017-01-01 (×2): 1000 mL

## 2016-12-31 MED ORDER — CHLORHEXIDINE GLUCONATE 0.12% ORAL RINSE (MEDLINE KIT)
15.0000 mL | Freq: Two times a day (BID) | OROMUCOSAL | Status: DC
  Administered 2016-12-31: 15 mL via OROMUCOSAL

## 2016-12-31 MED ORDER — SODIUM CHLORIDE 0.9% FLUSH
10.0000 mL | Freq: Two times a day (BID) | INTRAVENOUS | Status: DC
  Administered 2016-12-31: 10 mL
  Administered 2016-12-31: 30 mL
  Administered 2017-01-01 (×2): 10 mL

## 2016-12-31 MED ORDER — ASPIRIN 325 MG PO TABS
325.0000 mg | ORAL_TABLET | Freq: Every day | ORAL | Status: DC
  Administered 2016-12-31 – 2017-01-03 (×4): 325 mg
  Filled 2016-12-31 (×4): qty 1

## 2016-12-31 MED ORDER — VASOPRESSIN 20 UNIT/ML IV SOLN
0.0300 [IU]/min | INTRAVENOUS | Status: DC
  Administered 2016-12-31: 0.03 [IU]/min via INTRAVENOUS
  Filled 2016-12-31: qty 2

## 2016-12-31 MED ORDER — PROPOFOL 1000 MG/100ML IV EMUL
5.0000 ug/kg/min | INTRAVENOUS | Status: DC
  Administered 2016-12-31: 30 ug/kg/min via INTRAVENOUS
  Administered 2016-12-31: 20 ug/kg/min via INTRAVENOUS
  Administered 2016-12-31: 25 ug/kg/min via INTRAVENOUS
  Filled 2016-12-31 (×2): qty 100

## 2016-12-31 MED ORDER — PROPOFOL 1000 MG/100ML IV EMUL
INTRAVENOUS | Status: AC
  Filled 2016-12-31: qty 100

## 2016-12-31 MED ORDER — DEXTROSE IN LACTATED RINGERS 5 % IV SOLN
INTRAVENOUS | Status: DC
  Administered 2016-12-31: 09:00:00 via INTRAVENOUS
  Administered 2017-01-01: 50 mL/h via INTRAVENOUS

## 2016-12-31 MED ORDER — ATORVASTATIN CALCIUM 20 MG PO TABS
20.0000 mg | ORAL_TABLET | Freq: Every day | ORAL | Status: DC
  Administered 2016-12-31: 20 mg
  Filled 2016-12-31: qty 1

## 2016-12-31 MED ORDER — PRO-STAT SUGAR FREE PO LIQD
30.0000 mL | Freq: Two times a day (BID) | ORAL | Status: DC
  Administered 2016-12-31 (×2): 30 mL

## 2016-12-31 MED ORDER — HEPARIN BOLUS VIA INFUSION
3600.0000 [IU] | Freq: Once | INTRAVENOUS | Status: AC
  Administered 2016-12-31: 3600 [IU] via INTRAVENOUS
  Filled 2016-12-31: qty 3600

## 2016-12-31 MED ORDER — SODIUM CHLORIDE 0.9 % IV SOLN
0.4000 ug/kg/h | INTRAVENOUS | Status: DC
  Administered 2016-12-31: 0.4 ug/kg/h via INTRAVENOUS
  Administered 2017-01-01: 1.2 ug/kg/h via INTRAVENOUS
  Filled 2016-12-31 (×4): qty 2

## 2016-12-31 MED ORDER — NOREPINEPHRINE BITARTRATE 1 MG/ML IV SOLN
0.0000 ug/min | INTRAVENOUS | Status: DC
  Administered 2016-12-31: 7 ug/min via INTRAVENOUS
  Administered 2016-12-31: 6 ug/min via INTRAVENOUS
  Filled 2016-12-31 (×3): qty 16

## 2016-12-31 MED ORDER — MIDAZOLAM HCL 5 MG/ML IJ SOLN
0.5000 mg/h | INTRAMUSCULAR | Status: DC
  Administered 2016-12-31: 0.5 mg/h via INTRAVENOUS
  Filled 2016-12-31: qty 10

## 2016-12-31 NOTE — Progress Notes (Signed)
Notified Dr. Sung AmabileSimonds of magnesium level of 1.6. MD declined pharmacy replacement at this time, stated he would order replacement as needed at this time. Team will continue to monitor.

## 2016-12-31 NOTE — Care Management (Signed)
Per CSW history patient needed rehab last fall and discharged to [facility]. RNCM will continue to follow.

## 2016-12-31 NOTE — Progress Notes (Signed)
PULMONARY / CRITICAL CARE MEDICINE   Name: Patrick Patton MRN: [id number] DOB: 01/14/1956    ADMISSION DATE:  12/30/2016  PT PROFILE:  [age] M with seizure disorder brought to ED by EMS with status epilepticus. Intubated in ED. Witnessed aspiration during intubation. Severe metabolic acidosis, AKI, tachyarrhythmias all noted in the emergency department.  MAJOR EVENTS/TEST RESULTS: 06/18 admitted with status epilepticus as noted above 06/18 CT head: Tiny lacunar infarct mid right cerebellum, stable. No acute infarct noted. 06/18 CT C-spine: No abnormalities noted 06/19 progressive hypotension requiring vasopressors. Chest x-ray with increasing bilateral lower lobe infiltrates. Troponin I elevated at 1.40.  06/19 Cardiology consultation:  Neurology consultation:  06/19 Echocardiogram:   INDWELLING DEVICES:: ETT 06/18 >>  R [place] CVL 06/18 >>   MICRO DATA: MRSA PCR 06/18 >> NEG Urine 06/18 >>  Resp 06/18 >>  Blood 06/18 >>   ANTIMICROBIALS:  Meropenem 06/18 >>    SUBJECTIVE:  Sedated, intubated  VITAL SIGNS: BP (!) 88/67   Pulse 71   Temp 98.6 F (37 C) (Oral)   Resp 16   Ht 5\' 4"  (1.626 m)   Wt 136 lb 11 oz (62 kg)   SpO2 99%   BMI 23.46 kg/m   HEMODYNAMICS:    VENTILATOR SETTINGS: Vent Mode: PRVC FiO2 (%):  [60 %-100 %] 60 % Set Rate:  [22 bmp-26 bmp] 26 bmp Vt Set:  [500 mL] 500 mL PEEP:  [5 cmH20-10 cmH20] 5 cmH20 Plateau Pressure:  [14 cmH20-20 cmH20] 14 cmH20  INTAKE / OUTPUT: I/O last 3 completed shifts: In: 4038.5 [I.V.:1733.5; IV Piggyback:2305] Out: 300 [Urine:300]  PHYSICAL EXAMINATION: General: RASS -3, not F/C Neuro: mild anisocoria, pupils react, Doll's eyes intact, MAEs HEENT: NCAT, sclerae white Cardiovascular: Regular, no murmurs noted  Lungs: BS full, no wheezes, bilateral rhonchi  Abdomen: Midline scar, soft, diminished bowel sounds, NT Extremities: Cool, no edema  LABS:  BMET  Recent Labs Lab 12/30/16 1553 12/30/16 1918  12/31/16 0426  NA 143 139 139  K 3.9 3.4* 3.7  CL 104 107 108  CO2 13* 20* 24  BUN 9 8 10   CREATININE 2.07* 1.92* 2.07*  GLUCOSE 209* 290* 98    Electrolytes  Recent Labs Lab 12/30/16 1553 12/30/16 1918 12/31/16 0426 12/31/16 0944  CALCIUM 10.3 7.9* 6.8*  --   MG 2.4  --   --  1.6*  PHOS 5.0*  --   --   --     CBC  Recent Labs Lab 12/30/16 1553 12/30/16 2249 12/31/16 0426  WBC 12.2* 18.0* 18.5*  HGB 13.9 11.4* 12.2*  HCT 46.0 34.6* 37.2*  PLT 277 214 252    Coag's  Recent Labs Lab 12/31/16 0332  APTT 37*  INR 1.38    Sepsis Markers  Recent Labs Lab 12/30/16 1604 12/30/16 1918 12/30/16 1935 12/31/16 0426  LATICACIDVEN 21.5*  --  5.4*  --   PROCALCITON  --  0.60  --  6.48    ABG  Recent Labs Lab 12/30/16 1811 12/30/16 2320 12/31/16 0415  PHART 7.21* 7.26* 7.34*  PCO2ART 50* 46 42  PO2ART 62* 95 181*    Liver Enzymes  Recent Labs Lab 12/30/16 1553 12/31/16 0426  AST 26 26  ALT 10* 8*  ALKPHOS 126 75  BILITOT 0.6 0.7  ALBUMIN 4.7 2.6*    Cardiac Enzymes  Recent Labs Lab 12/30/16 1918 12/31/16 0010 12/31/16 0609  TROPONINI 0.35* 1.20* 1.40*    Glucose  Recent Labs Lab 12/30/16 1547 12/30/16  1919 12/30/16 2349 12/31/16 0411 12/31/16 0752 12/31/16 1159  GLUCAP 224* 298* 124* 88 117* 153*    CXR:  Bilateral basilar infiltrates/effusions   ASSESSMENT / PLAN:  PULMONARY A: Acute hypoxemic respiratory failure Suspected aspiration pneumonia P:   Cont vent support - settings reviewed and/or adjusted Cont vent bundle Daily SBT if/when meets criteria Cont empiric nebulized bronchodilators  CARDIOVASCULAR A:  Hypertension, resolved Tachycardia, resolved Septic shock Elevated troponin I P:  MAP goal > 65 mmHg Amiodarone infusion discontinued Aspirin, heparin infusion initiated Atorvastatin initiated Cardiology consultation requested  RENAL A:   AKI, oliguric CKD - baseline creatinine appears to be  1.7 range Severe lactic acidosis - resolved P:   Monitor BMET intermittently Monitor I/Os Correct electrolytes as indicated Discontinue HCO3 infusion  IVFs adjusted  GASTROINTESTINAL A:   No issues identified P:   SUP: IV famotidine Initiate TF protocol 06/20  HEMATOLOGIC A:   No issues identified P:  DVT px: Full dose heparin Monitor CBC intermittently Transfuse per usual guidelines   INFECTIOUS A:   Aspiration pneumonia Severe sepsis P:   Monitor temp, WBC count Micro and abx as above   ENDOCRINE A:   Stress-induced hyperglycemia, controlled P:   Continue sensitive scale SSI  NEUROLOGIC A:   Seizure disorder Status epilepticus, controlled Acute encephalopathy P:   RASS goal: -2, -3 Continue Propofol infusion Continue Keppra  Neurology consultation today EEG pending   FAMILY: No family at bedside   CCM time: 40 min The above time includes time spent in consultation with patient and/or family members and reviewing care plan on multidisciplinary rounds  Billy Fischer, MD PCCM service Mobile [phone] Pager [phone] 12/31/2016, 1:19 PM

## 2016-12-31 NOTE — Progress Notes (Signed)
Spoke with Dr.Simonds in person and due to patient's kidney function he decided not to replace magnesium at this time.Team will continue to monitor.

## 2016-12-31 NOTE — Consult Note (Signed)
Reason for Consult:Status Epilepticus Referring Physician: Alva Garnet  CC: Status Epilepticus  HPI: Patrick Patton is an 61 y.o. male who is unable to provide any history due to intubation and sedation and no family available.  All history obtained from the chart. According to EMS patient was found outside and confused, he was able to go up to his apartment where he had a witnessed grand mal seizure by police. This was brief. EMS got there and found to have pinpoint pupils and gave him Narcan. Shortly thereafter he had another grand mal seizure. Patient is known to have a seizure disorder according to notes. He broke on his own and then arrived here with an inability to give any history. He was postictal in appearance. He was breathing rapidly. Blood glucoses were in the reassuring range. Patient then had another grand mal seizure in the ED initiated with a right gaze preference that then generalized. This lasted for less than 2 minutes.  Ativan was administered. He had another grand mal seizure while in the room and was given again Ativan.  He was not back to baseline in between seizures.  Required intubation during which he was felt to aspirate.  Now on sedation.  No further seizure activity noted.  With sedation decrease this morning moved all extremities and seemed to purposely reach for the tube but did not follow commands.    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 Unknown if he has been off his Xanax.   Past Medical History:  Diagnosis Date  . Seizures ([place])    X10 years ago    Past Surgical History:  Procedure Laterality Date  . pin in arm    . SMALL INTESTINE SURGERY    . spleen removal      Family history: Parents deceased.  Father of an MI at 43.  Mother from complications of COPD.  Brother with CAD s/p MI  Social History:  reports that he has been smoking Cigarettes.  He has been smoking about 1.00 pack per day. His smokeless  tobacco use includes Chew and Snuff. He reports that he does not drink alcohol. His drug history is not on file.  Allergies  Allergen Reactions  . Penicillins Anaphylaxis and Swelling    .Has patient had a PCN reaction causing immediate rash, facial/tongue/throat swelling, SOB or lightheadedness with hypotension: Unknown Has patient had a PCN reaction causing severe rash involving mucus membranes or skin necrosis: No Has patient had a PCN reaction that required hospitalization: No Has patient had a PCN reaction occurring within the last 10 years: No If all of the above answers are "NO", then may proceed with Cephalosporin use.   . Codeine Hives    Medications:  I have reviewed the patient's current medications. Prior to Admission:  Prescriptions Prior to Admission  Medication Sig Dispense Refill Last Dose  . ALPRAZolam (XANAX) 0.5 MG tablet Take 1 tablet (0.5 mg total) by mouth 2 (two) times daily. 15 tablet 0 Past Month at Unknown time  . aspirin EC 81 MG EC tablet Take 1 tablet (81 mg total) by mouth daily.   Unknown at Unknown  . cholecalciferol (VITAMIN D) 1000 units tablet Take 2,000 Units by mouth daily.   Unknown at Unknown  . ketoconazole (NIZORAL) 2 % cream Apply 1 application topically daily.  11 Past Week at Unknown time  . Multiple Vitamin (MULTIVITAMIN WITH MINERALS) TABS tablet Take 1 tablet by mouth daily.  Unknown at Unknown  . thiamine 100 MG tablet Take 1 tablet (100 mg total) by mouth daily.   Unknown at Unknown  . feeding supplement, ENSURE ENLIVE, (ENSURE ENLIVE) LIQD Take 237 mLs by mouth 3 (three) times daily. 237 mL 12   . Oxycodone HCl 10 MG TABS Take 1 tablet (10 mg total) by mouth 4 (four) times daily as needed. (Patient not taking: Reported on 12/30/2016) 15 tablet 0 Completed Course at Unknown time  . pantoprazole (PROTONIX) 40 MG tablet Take 1 tablet (40 mg total) by mouth 2 (two) times daily. (Patient not taking: Reported on 12/30/2016) 60 tablet 0 Not Taking  at Unknown time  . rosuvastatin (CRESTOR) 20 MG tablet Take 1 tablet (20 mg total) by mouth daily. (Patient not taking: Reported on 12/30/2016) 30 tablet 0 Not Taking at Unknown time   Scheduled: . aspirin  325 mg Per Tube Daily  . atorvastatin  20 mg Per Tube q1800  . chlorhexidine gluconate (MEDLINE KIT)  15 mL Mouth Rinse BID  . feeding supplement (PRO-STAT SUGAR FREE 64)  30 mL Per Tube BID  . feeding supplement (VITAL HIGH PROTEIN)  1,000 mL Per Tube Q24H  . insulin aspart  0-9 Units Subcutaneous Q4H  . ipratropium-albuterol  3 mL Nebulization Q6H  . mouth rinse  15 mL Mouth Rinse 10 times per day  . sodium chloride flush  10-40 mL Intracatheter Q12H    ROS: Unable to provide due to intubation and sedation  Physical Examination: Blood pressure (!) 115/103, pulse 74, temperature 98.6 F (37 C), temperature source Oral, resp. rate (!) 25, height '5\' 4"'  (1.626 m), weight 62 kg (136 lb 11 oz), SpO2 100 %.  HEENT-  Normocephalic, no lesions, without obvious abnormality.  Normal external eye and conjunctiva.  Normal TM's bilaterally.  Normal auditory canals and external ears. Normal external nose, mucus membranes and septum.  Normal pharynx. Cardiovascular- S1, S2 normal, pulses palpable throughout   Lungs- chest clear, no wheezing, rales, normal symmetric air entry Abdomen- soft, non-tender; bowel sounds normal; no masses,  no organomegaly Extremities- no edema Lymph-no adenopathy palpable Musculoskeletal-no joint tenderness, deformity or swelling Skin-warm and dry, no hyperpigmentation, vitiligo, or suspicious lesions  Neurological Examination   Mental Status: Patient does not respond to verbal stimuli.  Does not respond to deep sternal rub.  Does not follow commands.  No verbalizations noted.  Cranial Nerves: II: patient does not respond confrontation bilaterally, pupils right 2 mm, left 3 mm,and reactive bilaterally III,IV,VI: doll's response absent bilaterally.  V,VII: corneal  reflex reduced bilaterally  VIII: patient does not respond to verbal stimuli IX,X: gag reflex reduced, XI: trapezius strength unable to test bilaterally XII: tongue strength unable to test Motor: Extremities flaccid throughout.  No spontaneous movement noted.  No purposeful movements noted. Sensory: Does not respond to noxious stimuli in any extremity. Deep Tendon Reflexes:  1+ and symmetric with absent AJ's bilaterally. Plantars: Mute bilaterally Cerebellar: Unable to perform   Laboratory Studies:   Basic Metabolic Panel:  Recent Labs Lab 12/30/16 1553 12/30/16 1918 12/31/16 0426 12/31/16 0944  NA 143 139 139  --   K 3.9 3.4* 3.7  --   CL 104 107 108  --   CO2 13* 20* 24  --   GLUCOSE 209* 290* 98  --   BUN '9 8 10  ' --   CREATININE 2.07* 1.92* 2.07*  --   CALCIUM 10.3 7.9* 6.8*  --   MG 2.4  --   --  1.6*  PHOS 5.0*  --   --   --     Liver Function Tests:  Recent Labs Lab 12/30/16 1553 12/31/16 0426  AST 26 26  ALT 10* 8*  ALKPHOS 126 75  BILITOT 0.6 0.7  PROT 8.5* 4.9*  ALBUMIN 4.7 2.6*    Recent Labs Lab 12/30/16 1553  LIPASE 26   No results for input(s): AMMONIA in the last 168 hours.  CBC:  Recent Labs Lab 12/30/16 1553 12/30/16 2249 12/31/16 0426  WBC 12.2* 18.0* 18.5*  NEUTROABS 8.8* 15.5*  --   HGB 13.9 11.4* 12.2*  HCT 46.0 34.6* 37.2*  MCV 97.8 89.1 89.1  PLT 277 214 252    Cardiac Enzymes:  Recent Labs Lab 12/30/16 1553 12/30/16 1918 12/31/16 0010 12/31/16 0609  TROPONINI 0.03* 0.35* 1.20* 1.40*    BNP: Invalid input(s): POCBNP  CBG:  Recent Labs Lab 12/30/16 1547 12/30/16 1919 12/30/16 2349 12/31/16 0411 12/31/16 0752  GLUCAP 224* 298* 124* 52 117*    Microbiology: Results for orders placed or performed during the hospital encounter of 12/30/16  Culture, blood (routine x 2)     Status: None (Preliminary result)   Collection Time: 12/30/16  4:04 PM  Result Value Ref Range Status   Specimen Description  BLOOD LEFT ANTECUBITAL  Final   Special Requests   Final    BOTTLES DRAWN AEROBIC AND ANAEROBIC Blood Culture results may not be optimal due to an excessive volume of blood received in culture bottles   Culture NO GROWTH < 24 HOURS  Final   Report Status PENDING  Incomplete  Culture, blood (routine x 2)     Status: None (Preliminary result)   Collection Time: 12/30/16  4:04 PM  Result Value Ref Range Status   Specimen Description BLOOD BLOOD LEFT HAND  Final   Special Requests   Final    BOTTLES DRAWN AEROBIC AND ANAEROBIC Blood Culture adequate volume   Culture NO GROWTH < 24 HOURS  Final   Report Status PENDING  Incomplete  MRSA PCR Screening     Status: None   Collection Time: 12/30/16  7:18 PM  Result Value Ref Range Status   MRSA by PCR NEGATIVE NEGATIVE Final    Comment:        The GeneXpert MRSA Assay (FDA approved for NASAL specimens only), is one component of a comprehensive MRSA colonization surveillance program. It is not intended to diagnose MRSA infection nor to guide or monitor treatment for MRSA infections.     Coagulation Studies:  Recent Labs  12/31/16 0332  LABPROT 17.1*  INR 1.38    Urinalysis:  Recent Labs Lab 12/30/16 2249  COLORURINE YELLOW*  LABSPEC 1.027  PHURINE 5.0  GLUCOSEU 150*  HGBUR NEGATIVE  BILIRUBINUR NEGATIVE  KETONESUR 5*  PROTEINUR 30*  NITRITE NEGATIVE  LEUKOCYTESUR NEGATIVE    Lipid Panel:     Component Value Date/Time   CHOL 97 06/03/2016 0405   TRIG 104 12/31/2016 0244   HDL 35 (L) 06/03/2016 0405   CHOLHDL 2.8 06/03/2016 0405   VLDL 20 06/03/2016 0405   LDLCALC 42 06/03/2016 0405    HgbA1C: No results found for: HGBA1C  Urine Drug Screen:     Component Value Date/Time   LABOPIA NONE DETECTED 12/30/2016 2249   COCAINSCRNUR NONE DETECTED 12/30/2016 2249   LABBENZ POSITIVE (A) 12/30/2016 2249   AMPHETMU NONE DETECTED 12/30/2016 2249   THCU POSITIVE (A) 12/30/2016 2249   LABBARB NONE DETECTED 12/30/2016  2249  Alcohol Level:  Recent Labs Lab 12/30/16 [place] <5    Other results: EKG: sinus rhythm at 76 bpm.  Imaging: Dg Abdomen 1 View  Result Date: 12/30/2016 CLINICAL DATA:  Orogastric tube placement EXAM: ABDOMEN - 1 VIEW COMPARISON:  Portable exam 1800 hours compared to 12/24/2011 FINDINGS: Tip of orogastric tube projects over stomach. IVC filter noted at L2-L3. Numerous sutures in EKG leads project over abdomen. External pacing leads project over inferior chest. Single air-filled loop of mildly distended nonspecific small bowel in LEFT upper quadrant. Small nonobstructing 7 mm RIGHT renal calculus. Bones appear demineralized. Lung bases emphysematous. IMPRESSION: Tip of orogastric tube projects over stomach. Nonobstructing 7 mm RIGHT renal calculus. Single air-filled loop of mildly distended nonspecific small bowel LEFT upper quadrant. Electronically Signed   By: Lavonia Dana M.D.   On: 12/30/2016 18:37   Ct Head Wo Contrast  Result Date: 12/30/2016 CLINICAL DATA:  Seizure with loss of consciousness scan apparent fall EXAM: CT HEAD WITHOUT CONTRAST CT CERVICAL SPINE WITHOUT CONTRAST TECHNIQUE: Multidetector CT imaging of the head and cervical spine was performed following the standard protocol without intravenous contrast. Multiplanar CT image reconstructions of the cervical spine were also generated. COMPARISON:  Head CT June 02, 2016; cervical spine CT December 23, 2011 FINDINGS: CT HEAD FINDINGS Brain: The ventricles are normal in size and configuration. There is no intracranial mass, hemorrhage, extra-axial fluid collection, or midline shift. There is a prior small lacunar infarct in the mid right cerebellum, stable. There is minimal small vessel disease in the centra semiovale bilaterally. There is no new gray-white compartment lesion. No acute infarct evident. Vascular: No hyperdense vessel. There is calcification in the left carotid siphon. There is calcification in each cavernous  carotid artery. Skull: The bony calvarium appears intact. Sinuses/Orbits: Visualized paranasal sinuses are clear. Orbits appear symmetric bilaterally. Other: Mastoid air cells are clear. CT CERVICAL SPINE FINDINGS Alignment: There is no spondylolisthesis. There is slight dextroscoliosis in the cervical region. Skull base and vertebrae: Skull base and craniocervical junction regions appear normal. There is no demonstrable fracture. No blastic or lytic bone lesions. Soft tissues and spinal canal: Prevertebral soft tissues and predental space regions are normal. No paraspinous lesions. No cord or canal hematoma appreciable. Disc levels: There is moderately severe disc space narrowing at C5-6. There is moderate disc space narrowing at C3-4. There is slightly milder disc space narrowing at C4-5. There is facet hypertrophy at multiple levels. There is exit foraminal narrowing due to bony hypertrophy at C3-4 bilaterally, at C4-5 on the left, and at C5-6 bilaterally. There is mild spinal stenosis at C5-6 due to diffuse disc protrusion and bony hypertrophy. No focal disc extrusion evident. Upper chest: Patient is intubated with the endotracheal tube in the visualized trachea. There is bullous disease in the visualized upper lung regions. No consolidation or pneumothorax evident. Other: There are foci of calcification in each carotid artery. IMPRESSION: CT head: Tiny lacunar infarct mid right cerebellum, stable. Minimal periventricular small vessel disease. No intracranial mass, hemorrhage, or extra-axial fluid collection. No acute infarct. Scattered foci of atherosclerotic vascular calcification. CT cervical spine: No fracture or spondylolisthesis. Multifocal osteoarthritic change. Mild spinal stenosis at C5-6 due to bony hypertrophy and disc protrusion. Foci of carotid artery calcification bilaterally. Emphysematous change noted in the lung apices. Patient intubated. Electronically Signed   By: Lowella Grip III M.D.    On: 12/30/2016 16:56   Ct Cervical Spine Wo Contrast  Result Date: 12/30/2016 CLINICAL DATA:  Seizure  with loss of consciousness scan apparent fall EXAM: CT HEAD WITHOUT CONTRAST CT CERVICAL SPINE WITHOUT CONTRAST TECHNIQUE: Multidetector CT imaging of the head and cervical spine was performed following the standard protocol without intravenous contrast. Multiplanar CT image reconstructions of the cervical spine were also generated. COMPARISON:  Head CT June 02, 2016; cervical spine CT December 23, 2011 FINDINGS: CT HEAD FINDINGS Brain: The ventricles are normal in size and configuration. There is no intracranial mass, hemorrhage, extra-axial fluid collection, or midline shift. There is a prior small lacunar infarct in the mid right cerebellum, stable. There is minimal small vessel disease in the centra semiovale bilaterally. There is no new gray-white compartment lesion. No acute infarct evident. Vascular: No hyperdense vessel. There is calcification in the left carotid siphon. There is calcification in each cavernous carotid artery. Skull: The bony calvarium appears intact. Sinuses/Orbits: Visualized paranasal sinuses are clear. Orbits appear symmetric bilaterally. Other: Mastoid air cells are clear. CT CERVICAL SPINE FINDINGS Alignment: There is no spondylolisthesis. There is slight dextroscoliosis in the cervical region. Skull base and vertebrae: Skull base and craniocervical junction regions appear normal. There is no demonstrable fracture. No blastic or lytic bone lesions. Soft tissues and spinal canal: Prevertebral soft tissues and predental space regions are normal. No paraspinous lesions. No cord or canal hematoma appreciable. Disc levels: There is moderately severe disc space narrowing at C5-6. There is moderate disc space narrowing at C3-4. There is slightly milder disc space narrowing at C4-5. There is facet hypertrophy at multiple levels. There is exit foraminal narrowing due to bony hypertrophy at  C3-4 bilaterally, at C4-5 on the left, and at C5-6 bilaterally. There is mild spinal stenosis at C5-6 due to diffuse disc protrusion and bony hypertrophy. No focal disc extrusion evident. Upper chest: Patient is intubated with the endotracheal tube in the visualized trachea. There is bullous disease in the visualized upper lung regions. No consolidation or pneumothorax evident. Other: There are foci of calcification in each carotid artery. IMPRESSION: CT head: Tiny lacunar infarct mid right cerebellum, stable. Minimal periventricular small vessel disease. No intracranial mass, hemorrhage, or extra-axial fluid collection. No acute infarct. Scattered foci of atherosclerotic vascular calcification. CT cervical spine: No fracture or spondylolisthesis. Multifocal osteoarthritic change. Mild spinal stenosis at C5-6 due to bony hypertrophy and disc protrusion. Foci of carotid artery calcification bilaterally. Emphysematous change noted in the lung apices. Patient intubated. Electronically Signed   By: Lowella Grip III M.D.   On: 12/30/2016 16:56   Dg Chest Port 1 View  Result Date: 12/31/2016 CLINICAL DATA:  Respiratory failure. EXAM: PORTABLE CHEST 1 VIEW COMPARISON:  Yesterday 1800 hour FINDINGS: Endotracheal tube 4.8 cm from the carina. Enteric tube in place, tip below the diaphragm not included in the field of view. Right subclavian central venous catheter tip in the mid proximal SVC. Previous question kink in the distal catheter cannot be assessed due to overlapping monitoring device. Increasing hazy opacity at the lung bases suspicious for pleural effusions and adjacent atelectasis. Mild enlargement of the cardiac silhouette. Suspect central pulmonary edema. No pneumothorax. Remote bilateral rib fractures. IMPRESSION: 1. Increasing hazy opacity at the lung bases suspicious for increasing pleural effusions and adjacent atelectasis. Mild enlargement of cardiac silhouette. Central pulmonary edema is stable. 2.  Stable support apparatus. Previously questioned kink in the distal tip of the left subclavian catheter cannot be assessed due to overlapping monitoring device. Electronically Signed   By: Jeb Levering M.D.   On: 12/31/2016 04:44   Dg Chest Portable  1 View  Result Date: 12/30/2016 CLINICAL DATA:  61 year old male status post enteric tube placement. EXAM: PORTABLE CHEST 1 VIEW COMPARISON:  Earlier Chest radiograph dated 12/30/2016 FINDINGS: Endotracheal tube approximately 5.3 cm above the carina. There has been interval placement of a right-sided central venous catheter with tip over central SVC. The tip of the catheter appears bent. An enteric tube is seen extending below the diaphragm with side port projecting over the gastric air and tip beyond the inferior margin of the image. There is hyperinflation of the lungs. Diffuse interstitial hazy densities again noted. A small left pleural effusion may be present. There is no pneumothorax. The cardiac silhouette is within normal limits. Transcutaneous pacer again noted. The osseous structures are intact. IMPRESSION: 1. Interval placement of an enteric tube with sideport below the diaphragm and tip beyond the inferior margin of the image. Endotracheal tube remains above the carina. 2. Right-sided central venous catheter with tip over central SVC. The tip of the catheter appears bent. Clinical assessment of the catheter recommended to ensure proper functioning. Go 3. Diffuse hazy densities and interstitial prominence likely representing pulmonary edema. A small left pleural effusion versus atelectasis. Electronically Signed   By: Anner Crete M.D.   On: 12/30/2016 18:38   Dg Chest Port 1 View  Result Date: 12/30/2016 CLINICAL DATA:  61 y/o  M; post intubation. EXAM: PORTABLE CHEST 1 VIEW COMPARISON:  06/03/2016 chest radiograph FINDINGS: Stable cardiac silhouette given projection and technique. Endotracheal tube 2 cm from the carina. Transcutaneous pacers  noted. Diffuse hazy opacification of lungs interstitial prominence probably representing mild pulmonary edema. No pleural effusion. Bones are unremarkable. IMPRESSION: Endotracheal tube 2 cm from the carina. Hazy opacification of lungs in interstitial prominence probably representing mild pulmonary edema. Electronically Signed   By: Kristine Garbe M.D.   On: 12/30/2016 16:23     Assessment/Plan: 61 year old male with a history of seizures and Xanax use who presents in status epilepticus.  Patient unable to provide any history.  Loaded with Keppra in the ED and continues on maintenance IV.  Has not returned to baseline.  Intubated and sedated.  Patient acidotic but otherwise lab work relatively unremarkable.  Head CT reviewed and shows no acute changes.    Recommendations: 1.  Continue Keppra at 565m IV q 12hours 2.  EEG today.   3.  Seizure precautions  LAlexis Goodell MD Neurology [phone]/19/2018, 11:26 AM

## 2016-12-31 NOTE — Progress Notes (Signed)
Attempted to place left femoral arterial line, however catheter clotted off.  Therefore, attempted to place right femoral arterial line and catheter clotted off as well. Dressings placed bilateral groins no hematoma or bleeding present at sites dressings dry and intact and CBC stable post arterial line insertion attempts.  Sonda Rumbleana Esperanza Madrazo, AGNP  Pulmonary/Critical Care Pager [phone] (please enter 7 digits) PCCM Consult Pager [phone] (please enter 7 digits)

## 2016-12-31 NOTE — Consult Note (Signed)
[facility] Cardiology  CARDIOLOGY CONSULT NOTE  Patient ID: MIKEL HARDGROVE MRN: [id number] DOB/AGE: 20-Jan-1956 61 y.o.  Admit date: 12/30/2016 Referring Physician Sung Amabile Primary Physician [facility] Primary Cardiologist Fath Reason for Consultation Elevated troponin, abnormal ECG  HPI: 61 year old male referred for elevated troponin and abnormal ECG. The patient has a history of non-STEMI in 05/2016, treated medically, renal insufficiency, familial hyperlipidemia, and seizure disorder. The patient was brought to [facility] ER yesterday evening with status epilepticus. There was witnessed aspiration during intubation. Patient was noted to have severe metabolic acidosis and acute kidney injury. Admission labs notable for elevated troponin of 0.03, followed by 0.35, 1.20, and peaking at 1.40. Tachyarrhythmias, including atrial fibrillation with RVR, were noted on telemetry in the ER; the patient was given a bolus of amiodarone, and has remained in normal sinus rhythm, rate controlled. 2D echocardiogram on 09/04/16 revealed normal left ventricular function with LVEF 55% with mild AR and mild TR. The patient is intubated and unable to answer questions.    Past Medical History:  Diagnosis Date  . Seizures (HCC)    X10 years ago    Past Surgical History:  Procedure Laterality Date  . pin in arm    . SMALL INTESTINE SURGERY    . spleen removal      Prescriptions Prior to Admission  Medication Sig Dispense Refill Last Dose  . ALPRAZolam (XANAX) 0.5 MG tablet Take 1 tablet (0.5 mg total) by mouth 2 (two) times daily. 15 tablet 0 Past Month at Unknown time  . aspirin EC 81 MG EC tablet Take 1 tablet (81 mg total) by mouth daily.   Unknown at Unknown  . cholecalciferol (VITAMIN D) 1000 units tablet Take 2,000 Units by mouth daily.   Unknown at Unknown  . ketoconazole (NIZORAL) 2 % cream Apply 1 application topically daily.  11 Past Week at Unknown time  . Multiple Vitamin (MULTIVITAMIN WITH MINERALS) TABS  tablet Take 1 tablet by mouth daily.   Unknown at Unknown  . thiamine 100 MG tablet Take 1 tablet (100 mg total) by mouth daily.   Unknown at Unknown  . feeding supplement, ENSURE ENLIVE, (ENSURE ENLIVE) LIQD Take 237 mLs by mouth 3 (three) times daily. 237 mL 12   . Oxycodone HCl 10 MG TABS Take 1 tablet (10 mg total) by mouth 4 (four) times daily as needed. (Patient not taking: Reported on 12/30/2016) 15 tablet 0 Completed Course at Unknown time  . pantoprazole (PROTONIX) 40 MG tablet Take 1 tablet (40 mg total) by mouth 2 (two) times daily. (Patient not taking: Reported on 12/30/2016) 60 tablet 0 Not Taking at Unknown time  . rosuvastatin (CRESTOR) 20 MG tablet Take 1 tablet (20 mg total) by mouth daily. (Patient not taking: Reported on 12/30/2016) 30 tablet 0 Not Taking at Unknown time   Social History   Social History  . Marital status: Divorced    Spouse name: N/A  . Number of children: N/A  . Years of education: N/A   Occupational History  . Not on file.   Social History Main Topics  . Smoking status: Current Every Day Smoker    Packs/day: 1.00    Types: Cigarettes  . Smokeless tobacco: Current User    Types: Chew, Snuff  . Alcohol use No  . Drug use: Unknown  . Sexual activity: Not on file   Other Topics Concern  . Not on file   Social History Narrative  . No narrative on file    No family  history on file.    Review of systems complete not able to be completed due to patient's intubated state.     PHYSICAL EXAM  General: Intubated. HEENT:  Normocephalic and atramatic Neck:  No JVD.  Lungs: Normal effort of breathing, on vent Heart: HRRR . Normal S1 and S2 without gallops or murmurs.  Abdomen: Bowel sounds are positive, abdomen soft Msk:  Patient lying supine in bed Extremities: No clubbing, cyanosis or edema.   Neuro: Intubated Psych:  intubated  Labs:   Lab Results  Component Value Date   WBC 18.5 (H) 12/31/2016   HGB 12.2 (L) 12/31/2016   HCT 37.2  (L) 12/31/2016   MCV 89.1 12/31/2016   PLT 252 12/31/2016    Recent Labs Lab 12/31/16 0426  NA 139  K 3.7  CL 108  CO2 24  BUN 10  CREATININE 2.07*  CALCIUM 6.8*  PROT 4.9*  BILITOT 0.7  ALKPHOS 75  ALT 8*  AST 26  GLUCOSE 98   Lab Results  Component Value Date   CKTOTAL 1,815 (H) 05/31/2016   TROPONINI 1.40 (HH) 12/31/2016    Lab Results  Component Value Date   CHOL 97 06/03/2016   Lab Results  Component Value Date   HDL 35 (L) 06/03/2016   Lab Results  Component Value Date   LDLCALC 42 06/03/2016   Lab Results  Component Value Date   TRIG 104 12/31/2016   TRIG 169 (H) 12/30/2016   TRIG 98 06/03/2016   Lab Results  Component Value Date   CHOLHDL 2.8 06/03/2016   No results found for: LDLDIRECT    Radiology: Dg Abdomen 1 View  Result Date: 12/30/2016 CLINICAL DATA:  Orogastric tube placement EXAM: ABDOMEN - 1 VIEW COMPARISON:  Portable exam 1800 hours compared to 12/24/2011 FINDINGS: Tip of orogastric tube projects over stomach. IVC filter noted at L2-L3. Numerous sutures in EKG leads project over abdomen. External pacing leads project over inferior chest. Single air-filled loop of mildly distended nonspecific small bowel in LEFT upper quadrant. Small nonobstructing 7 mm RIGHT renal calculus. Bones appear demineralized. Lung bases emphysematous. IMPRESSION: Tip of orogastric tube projects over stomach. Nonobstructing 7 mm RIGHT renal calculus. Single air-filled loop of mildly distended nonspecific small bowel LEFT upper quadrant. Electronically Signed   By: Ulyses Southward M.D.   On: 12/30/2016 18:37   Ct Head Wo Contrast  Result Date: 12/30/2016 CLINICAL DATA:  Seizure with loss of consciousness scan apparent fall EXAM: CT HEAD WITHOUT CONTRAST CT CERVICAL SPINE WITHOUT CONTRAST TECHNIQUE: Multidetector CT imaging of the head and cervical spine was performed following the standard protocol without intravenous contrast. Multiplanar CT image reconstructions of the  cervical spine were also generated. COMPARISON:  Head CT June 02, 2016; cervical spine CT December 23, 2011 FINDINGS: CT HEAD FINDINGS Brain: The ventricles are normal in size and configuration. There is no intracranial mass, hemorrhage, extra-axial fluid collection, or midline shift. There is a prior small lacunar infarct in the mid right cerebellum, stable. There is minimal small vessel disease in the centra semiovale bilaterally. There is no new gray-white compartment lesion. No acute infarct evident. Vascular: No hyperdense vessel. There is calcification in the left carotid siphon. There is calcification in each cavernous carotid artery. Skull: The bony calvarium appears intact. Sinuses/Orbits: Visualized paranasal sinuses are clear. Orbits appear symmetric bilaterally. Other: Mastoid air cells are clear. CT CERVICAL SPINE FINDINGS Alignment: There is no spondylolisthesis. There is slight dextroscoliosis in the cervical region. Skull base and vertebrae:  Skull base and craniocervical junction regions appear normal. There is no demonstrable fracture. No blastic or lytic bone lesions. Soft tissues and spinal canal: Prevertebral soft tissues and predental space regions are normal. No paraspinous lesions. No cord or canal hematoma appreciable. Disc levels: There is moderately severe disc space narrowing at C5-6. There is moderate disc space narrowing at C3-4. There is slightly milder disc space narrowing at C4-5. There is facet hypertrophy at multiple levels. There is exit foraminal narrowing due to bony hypertrophy at C3-4 bilaterally, at C4-5 on the left, and at C5-6 bilaterally. There is mild spinal stenosis at C5-6 due to diffuse disc protrusion and bony hypertrophy. No focal disc extrusion evident. Upper chest: Patient is intubated with the endotracheal tube in the visualized trachea. There is bullous disease in the visualized upper lung regions. No consolidation or pneumothorax evident. Other: There are foci of  calcification in each carotid artery. IMPRESSION: CT head: Tiny lacunar infarct mid right cerebellum, stable. Minimal periventricular small vessel disease. No intracranial mass, hemorrhage, or extra-axial fluid collection. No acute infarct. Scattered foci of atherosclerotic vascular calcification. CT cervical spine: No fracture or spondylolisthesis. Multifocal osteoarthritic change. Mild spinal stenosis at C5-6 due to bony hypertrophy and disc protrusion. Foci of carotid artery calcification bilaterally. Emphysematous change noted in the lung apices. Patient intubated. Electronically Signed   By: Bretta Bang III M.D.   On: 12/30/2016 16:56   Ct Cervical Spine Wo Contrast  Result Date: 12/30/2016 CLINICAL DATA:  Seizure with loss of consciousness scan apparent fall EXAM: CT HEAD WITHOUT CONTRAST CT CERVICAL SPINE WITHOUT CONTRAST TECHNIQUE: Multidetector CT imaging of the head and cervical spine was performed following the standard protocol without intravenous contrast. Multiplanar CT image reconstructions of the cervical spine were also generated. COMPARISON:  Head CT June 02, 2016; cervical spine CT December 23, 2011 FINDINGS: CT HEAD FINDINGS Brain: The ventricles are normal in size and configuration. There is no intracranial mass, hemorrhage, extra-axial fluid collection, or midline shift. There is a prior small lacunar infarct in the mid right cerebellum, stable. There is minimal small vessel disease in the centra semiovale bilaterally. There is no new gray-white compartment lesion. No acute infarct evident. Vascular: No hyperdense vessel. There is calcification in the left carotid siphon. There is calcification in each cavernous carotid artery. Skull: The bony calvarium appears intact. Sinuses/Orbits: Visualized paranasal sinuses are clear. Orbits appear symmetric bilaterally. Other: Mastoid air cells are clear. CT CERVICAL SPINE FINDINGS Alignment: There is no spondylolisthesis. There is slight  dextroscoliosis in the cervical region. Skull base and vertebrae: Skull base and craniocervical junction regions appear normal. There is no demonstrable fracture. No blastic or lytic bone lesions. Soft tissues and spinal canal: Prevertebral soft tissues and predental space regions are normal. No paraspinous lesions. No cord or canal hematoma appreciable. Disc levels: There is moderately severe disc space narrowing at C5-6. There is moderate disc space narrowing at C3-4. There is slightly milder disc space narrowing at C4-5. There is facet hypertrophy at multiple levels. There is exit foraminal narrowing due to bony hypertrophy at C3-4 bilaterally, at C4-5 on the left, and at C5-6 bilaterally. There is mild spinal stenosis at C5-6 due to diffuse disc protrusion and bony hypertrophy. No focal disc extrusion evident. Upper chest: Patient is intubated with the endotracheal tube in the visualized trachea. There is bullous disease in the visualized upper lung regions. No consolidation or pneumothorax evident. Other: There are foci of calcification in each carotid artery. IMPRESSION: CT head: Tiny  lacunar infarct mid right cerebellum, stable. Minimal periventricular small vessel disease. No intracranial mass, hemorrhage, or extra-axial fluid collection. No acute infarct. Scattered foci of atherosclerotic vascular calcification. CT cervical spine: No fracture or spondylolisthesis. Multifocal osteoarthritic change. Mild spinal stenosis at C5-6 due to bony hypertrophy and disc protrusion. Foci of carotid artery calcification bilaterally. Emphysematous change noted in the lung apices. Patient intubated. Electronically Signed   By: Bretta BangWilliam  Woodruff III M.D.   On: 12/30/2016 16:56   Dg Chest Port 1 View  Result Date: 12/31/2016 CLINICAL DATA:  Respiratory failure. EXAM: PORTABLE CHEST 1 VIEW COMPARISON:  Yesterday 1800 hour FINDINGS: Endotracheal tube 4.8 cm from the carina. Enteric tube in place, tip below the diaphragm  not included in the field of view. Right subclavian central venous catheter tip in the mid proximal SVC. Previous question kink in the distal catheter cannot be assessed due to overlapping monitoring device. Increasing hazy opacity at the lung bases suspicious for pleural effusions and adjacent atelectasis. Mild enlargement of the cardiac silhouette. Suspect central pulmonary edema. No pneumothorax. Remote bilateral rib fractures. IMPRESSION: 1. Increasing hazy opacity at the lung bases suspicious for increasing pleural effusions and adjacent atelectasis. Mild enlargement of cardiac silhouette. Central pulmonary edema is stable. 2. Stable support apparatus. Previously questioned kink in the distal tip of the left subclavian catheter cannot be assessed due to overlapping monitoring device. Electronically Signed   By: Rubye OaksMelanie  Ehinger M.D.   On: 12/31/2016 04:44   Dg Chest Portable 1 View  Result Date: 12/30/2016 CLINICAL DATA:  61 year old male status post enteric tube placement. EXAM: PORTABLE CHEST 1 VIEW COMPARISON:  Earlier Chest radiograph dated 12/30/2016 FINDINGS: Endotracheal tube approximately 5.3 cm above the carina. There has been interval placement of a right-sided central venous catheter with tip over central SVC. The tip of the catheter appears bent. An enteric tube is seen extending below the diaphragm with side port projecting over the gastric air and tip beyond the inferior margin of the image. There is hyperinflation of the lungs. Diffuse interstitial hazy densities again noted. A small left pleural effusion may be present. There is no pneumothorax. The cardiac silhouette is within normal limits. Transcutaneous pacer again noted. The osseous structures are intact. IMPRESSION: 1. Interval placement of an enteric tube with sideport below the diaphragm and tip beyond the inferior margin of the image. Endotracheal tube remains above the carina. 2. Right-sided central venous catheter with tip over  central SVC. The tip of the catheter appears bent. Clinical assessment of the catheter recommended to ensure proper functioning. Go 3. Diffuse hazy densities and interstitial prominence likely representing pulmonary edema. A small left pleural effusion versus atelectasis. Electronically Signed   By: Elgie CollardArash  Radparvar M.D.   On: 12/30/2016 18:38   Dg Chest Port 1 View  Result Date: 12/30/2016 CLINICAL DATA:  61 y/o  M; post intubation. EXAM: PORTABLE CHEST 1 VIEW COMPARISON:  06/03/2016 chest radiograph FINDINGS: Stable cardiac silhouette given projection and technique. Endotracheal tube 2 cm from the carina. Transcutaneous pacers noted. Diffuse hazy opacification of lungs interstitial prominence probably representing mild pulmonary edema. No pleural effusion. Bones are unremarkable. IMPRESSION: Endotracheal tube 2 cm from the carina. Hazy opacification of lungs in interstitial prominence probably representing mild pulmonary edema. Electronically Signed   By: Mitzi HansenLance  Furusawa-Stratton M.D.   On: 12/30/2016 16:23    EKG: NSR, 74 bpm  ASSESSMENT AND PLAN:  1. Elevated troponin, likely demand supply ischemia in the setting of acute hypoxemic respiratory failure, tachyarrhythmias, and  metabolic acidosis 2. Atrial fibrillation with RVR, with no prior history of such, converted to sinus rhythm eventually after a bolus of amiodarone 3. Acute hypoxemic respiratory failure with suspected aspiration pneumonia 4. Seizure disorder with recent status epilepticus 5. AKI with CKD   Recommendations: 1. Continue current therapy. 2. Per Dr. Darrold Junker, recommend discontinuing heparin, as this is not likely ACS 3. Defer chronic anticoagulation for stroke prevention at this time in light of likely reversible cause of atrial fibrillation 4. Review 2D echocardiogram   Signed: Leanora Ivanoff, PA-C 12/31/2016, 1:46 PM

## 2016-12-31 NOTE — Progress Notes (Signed)
ANTICOAGULATION CONSULT NOTE - Initial Consult  Pharmacy Consult for heparin dosing Indication: chest pain/ACS  Allergies  Allergen Reactions  . Penicillins Anaphylaxis and Swelling    .Has patient had a PCN reaction causing immediate rash, facial/tongue/throat swelling, SOB or lightheadedness with hypotension: Unknown Has patient had a PCN reaction causing severe rash involving mucus membranes or skin necrosis: No Has patient had a PCN reaction that required hospitalization: No Has patient had a PCN reaction occurring within the last 10 years: No If all of the above answers are "NO", then may proceed with Cephalosporin use.   . Codeine Hives    Patient Measurements: Height: '5\' 4"'  (162.6 cm) Weight: 136 lb 11 oz (62 kg) IBW/kg (Calculated) : 59.2 Heparin Dosing Weight: 60 kg  Vital Signs: Temp: 97.9 F (36.6 C) (06/19 1600) Temp Source: Oral (06/19 1600) BP: 109/79 (06/19 1600) Pulse Rate: 69 (06/19 1600)  Labs:  Recent Labs  12/30/16 1553 12/30/16 1918 12/30/16 2249 12/31/16 0010 12/31/16 0332 12/31/16 0426 12/31/16 0609 12/31/16 0944 12/31/16 1607  HGB 13.9  --  11.4*  --   --  12.2*  --   --   --   HCT 46.0  --  34.6*  --   --  37.2*  --   --   --   PLT 277  --  214  --   --  252  --   --   --   APTT  --   --   --   --  37*  --   --   --   --   LABPROT  --   --   --   --  17.1*  --   --   --   --   INR  --   --   --   --  1.38  --   --   --   --   HEPARINUNFRC  --   --   --   --  <0.10*  --   --  0.41 0.33  CREATININE 2.07* 1.92*  --   --   --  2.07*  --   --   --   TROPONINI 0.03* 0.35*  --  1.20*  --   --  1.40*  --   --     Estimated Creatinine Clearance: 31.4 mL/min (A) (by C-G formula based on SCr of 2.07 mg/dL (H)).   Medical History: Past Medical History:  Diagnosis Date  . Seizures ([place])    X10 years ago    Medications:  Scheduled:  . aspirin  325 mg Per Tube Daily  . atorvastatin  20 mg Per Tube q1800  . chlorhexidine gluconate (MEDLINE  KIT)  15 mL Mouth Rinse BID  . feeding supplement (PRO-STAT SUGAR FREE 64)  30 mL Per Tube BID  . feeding supplement (VITAL HIGH PROTEIN)  1,000 mL Per Tube Q24H  . insulin aspart  0-9 Units Subcutaneous Q4H  . ipratropium-albuterol  3 mL Nebulization Q6H  . mouth rinse  15 mL Mouth Rinse 10 times per day  . sodium chloride flush  10-40 mL Intracatheter Q12H    Assessment: Patient admitted for seizure d/o w/ status epilepticus and suffered aspiration during intubation. Now has elevated trops up to 1.20 and is being started on heparin drip.  Goal of Therapy:  Heparin level 0.3-0.7 units/ml Monitor platelets by anticoagulation protocol: Yes   Plan:  Heparin level therapeutic at 0.33, however is trending down. Will increase rate  from 750 units/hr to 800 units/hr and recheck in 6 hours.  Ramond Dial, Pharm.D, BCPS Clinical Pharmacist  12/31/2016,4:55 PM

## 2016-12-31 NOTE — Plan of Care (Signed)
Problem: Physical Regulation: Goal: Ability to maintain clinical measurements within normal limits will improve Outcome: Progressing Levophed titrated during shift per order parameters, down to 7 mcg/kg/min by 18:15.  Problem: Nutritional: Goal: Intake of prescribed amount of daily calories will improve Outcome: Progressing Patient started on vital high protein at 20 mL/hr, confirmed with MD during rounds for no titration at this time. Abdomen soft, unknown last bowel movement but prior to admission on 12/30/16.

## 2016-12-31 NOTE — Progress Notes (Signed)
Initial Nutrition Assessment  DOCUMENTATION CODES:   Not applicable  INTERVENTION:  1.Patient on Trickle feeds given being critically ill, on multiple pressors. Vital High Protein @ 6520mL/hr via OGT 2. When hemodynamically stable, recommend switch patient to Vital 1.5 @ 3820mL/hr, increase by 10 every 6 hours to goal rate of 6850mL/hr At goal provides 1800 calories, 81gm protein, 924cc free water  NUTRITION DIAGNOSIS:   Inadequate oral intake related to inability to eat as evidenced by NPO status.  GOAL:   Patient will meet greater than or equal to 90% of their needs  MONITOR:   I & O's, Labs, Vent status, TF tolerance, Skin, Weight trends  REASON FOR ASSESSMENT:   Ventilator    ASSESSMENT:   Patrick Patton is a 61 y.o. male admitted on 12/30/2016 with aspiration pneumonia/witnessed aspiration upon intubation  Patient is currently intubated on ventilator support MV: 13 L/min Temp (24hrs), Avg:98.9 F (37.2 C), Min:98 F (36.7 C), Max:100 F (37.8 C) Propofol: 759ml/hr --> 238 calories Severe Sepsis w/ Shock Discussed in Rounds Unable to complete Nutrition-Focused physical exam at this time.  Labs and medications reviewed: Mg 1.6 D5 LR @ 6650mL/hr --> 204 calories Heparin gtt, Levo gtt, Vaso gtt  Diet Order:     Skin:  Reviewed, no issues  Last BM:  PTA  Height:   Ht Readings from Last 1 Encounters:  12/30/16 5\' 4"  (1.626 m)    Weight:   Wt Readings from Last 1 Encounters:  12/31/16 136 lb 11 oz (62 kg)    Ideal Body Weight:  59.09 kg  BMI:  Body mass index is 23.46 kg/m.  Estimated Nutritional Needs:   Kcal:  1791 calories  Protein:  74 - 93 grams  Fluid:  Per MD/NP/PA  EDUCATION NEEDS:   No education needs identified at this time  Dionne AnoWilliam M. Kimberli Winne, MS, RD LDN Inpatient Clinical Dietitian Pager [phone]

## 2016-12-31 NOTE — Procedures (Addendum)
PROCEDURE NOTE: CVL PLACEMENT  This procedure was performed on 12/30/16  INDICATION:    Monitoring of central venous pressures and/or administration of medications optimally administered in central vein  CONSENT:   Performed as emergent procedure  PROCEDURE  Sterile technique was used including antiseptics, cap, gloves, gown, hand hygiene, mask and full body sheet.  Skin prep: Chlorhexidine; local anesthetic administered  A triple lumen catheter was placed in the R [place] vein using the Seldinger technique.    EVALUATION:  Blood flow good  Complications: No apparent complications  Patient tolerated the procedure well.  Chest X-ray ordered to verify placement and is pending   Billy Fischeravid Riddhi Grether, MD PCCM service Mobile [phone] Pager [phone] 12/30/2016 5:53 PM

## 2016-12-31 NOTE — Progress Notes (Signed)
Patient appears to be changing from a normal sinus rhythm to an accerlated junctional rhythm with a bundle branch block. Notified Sonda Rumbleana Blakeney RN who ordered a STAT 12 lead EKG and a magnesium level added to the morning labs. Team will continue to monitor.

## 2016-12-31 NOTE — Progress Notes (Signed)
Pharmacy Antibiotic Note  Patrick Patton is a 61 y.o. male admitted on 12/30/2016 with ?aspiration pneumonia/Witnessed aspiration during intubation. Marland Kitchen.  Pharmacy has been consulted for Meropenem dosing.  Plan: Continue meropenem 1 g iv q 12 hours.   Discussed possible transition to ceftriaxone and metronidazole with Dr. Sung Patton due to the theoretical potential of carbapenem to aggravate seizure disorder. Dr. Sung Patton wants to continue meropenem for now and narrow based on culture results.    Height: 5\' 4"  (162.6 cm) Weight: 136 lb 11 oz (62 kg) IBW/kg (Calculated) : 59.2  Temp (24hrs), Avg:98.8 F (37.1 C), Min:98 F (36.7 C), Max:100 F (37.8 C)   Recent Labs Lab 12/30/16 1553 12/30/16 1604 12/30/16 1918 12/30/16 1935 12/30/16 2249 12/31/16 0426  WBC 12.2*  --   --   --  18.0* 18.5*  CREATININE 2.07*  --  1.92*  --   --  2.07*  LATICACIDVEN  --  21.5*  --  5.4*  --   --     Estimated Creatinine Clearance: 31.4 mL/min (A) (by C-G formula based on SCr of 2.07 mg/dL (H)).    Allergies  Allergen Reactions  . Penicillins Anaphylaxis and Swelling    .Has patient had a PCN reaction causing immediate rash, facial/tongue/throat swelling, SOB or lightheadedness with hypotension: Unknown Has patient had a PCN reaction causing severe rash involving mucus membranes or skin necrosis: No Has patient had a PCN reaction that required hospitalization: No Has patient had a PCN reaction occurring within the last 10 years: No If all of the above answers are "NO", then may proceed with Cephalosporin use.   . Codeine Hives    Antimicrobials this admission: Vanc x 1 6/18  Meropenem  6/18 >>    Dose adjustments this admission:    Microbiology results: 6/18 BCx: P 6/18 UCx: sent 6/18 Trach aspirate: sent MRSA PCR: negative  Thank you for allowing pharmacy to be a part of this patient's care.  Patrick Patton, Patrick Patton 12/31/2016 3:03 PM

## 2016-12-31 NOTE — Progress Notes (Signed)
ANTICOAGULATION CONSULT NOTE - Initial Consult  Pharmacy Consult for heparin dosing Indication: chest pain/ACS  Allergies  Allergen Reactions  . Penicillins Anaphylaxis and Swelling    .Has patient had a PCN reaction causing immediate rash, facial/tongue/throat swelling, SOB or lightheadedness with hypotension: Unknown Has patient had a PCN reaction causing severe rash involving mucus membranes or skin necrosis: No Has patient had a PCN reaction that required hospitalization: No Has patient had a PCN reaction occurring within the last 10 years: No If all of the above answers are "NO", then may proceed with Cephalosporin use.   . Codeine Hives    Patient Measurements: Height: '5\' 4"'  (162.6 cm) Weight: 136 lb 11 oz (62 kg) IBW/kg (Calculated) : 59.2 Heparin Dosing Weight: 60 kg  Vital Signs: Temp: 98.5 F (36.9 C) (06/19 1200) Temp Source: Oral (06/19 1200) BP: 100/86 (06/19 1445) Pulse Rate: 70 (06/19 1445)  Labs:  Recent Labs  12/30/16 1553 12/30/16 1918 12/30/16 2249 12/31/16 0010 12/31/16 0332 12/31/16 0426 12/31/16 0609 12/31/16 0944  HGB 13.9  --  11.4*  --   --  12.2*  --   --   HCT 46.0  --  34.6*  --   --  37.2*  --   --   PLT 277  --  214  --   --  252  --   --   APTT  --   --   --   --  37*  --   --   --   LABPROT  --   --   --   --  17.1*  --   --   --   INR  --   --   --   --  1.38  --   --   --   HEPARINUNFRC  --   --   --   --  <0.10*  --   --  0.41  CREATININE 2.07* 1.92*  --   --   --  2.07*  --   --   TROPONINI 0.03* 0.35*  --  1.20*  --   --  1.40*  --     Estimated Creatinine Clearance: 31.4 mL/min (A) (by C-G formula based on SCr of 2.07 mg/dL (H)).   Medical History: Past Medical History:  Diagnosis Date  . Seizures ([place])    X10 years ago    Medications:  Scheduled:  . aspirin  325 mg Per Tube Daily  . atorvastatin  20 mg Per Tube q1800  . chlorhexidine gluconate (MEDLINE KIT)  15 mL Mouth Rinse BID  . feeding supplement (PRO-STAT  SUGAR FREE 64)  30 mL Per Tube BID  . feeding supplement (VITAL HIGH PROTEIN)  1,000 mL Per Tube Q24H  . insulin aspart  0-9 Units Subcutaneous Q4H  . ipratropium-albuterol  3 mL Nebulization Q6H  . mouth rinse  15 mL Mouth Rinse 10 times per day  . sodium chloride flush  10-40 mL Intracatheter Q12H    Assessment: Patient admitted for seizure d/o w/ status epilepticus and suffered aspiration during intubation. Now has elevated trops up to 1.20 and is being started on heparin drip.  Goal of Therapy:  Heparin level 0.3-0.7 units/ml Monitor platelets by anticoagulation protocol: Yes   Plan:  Continue heparin infusion at current rate and recheck a HL in 6 hours.   Ulice Dash D 12/31/2016,2:56 PM

## 2016-12-31 NOTE — Progress Notes (Signed)
ANTICOAGULATION CONSULT NOTE - Initial Consult  Pharmacy Consult for heparin dosing Indication: chest pain/ACS  Allergies  Allergen Reactions  . Penicillins Anaphylaxis and Swelling    .Has patient had a PCN reaction causing immediate rash, facial/tongue/throat swelling, SOB or lightheadedness with hypotension: Unknown Has patient had a PCN reaction causing severe rash involving mucus membranes or skin necrosis: No Has patient had a PCN reaction that required hospitalization: No Has patient had a PCN reaction occurring within the last 10 years: No If all of the above answers are "NO", then may proceed with Cephalosporin use.   . Codeine Hives    Patient Measurements: Height: '5\' 4"'  (162.6 cm) Weight: 132 lb 0.9 oz (59.9 kg) IBW/kg (Calculated) : 59.2 Heparin Dosing Weight: 60 kg  Vital Signs: Temp: 98 F (36.7 C) (06/19 0319) Temp Source: Oral (06/19 0319) BP: 77/66 (06/19 0115) Pulse Rate: 91 (06/19 0115)  Labs:  Recent Labs  12/30/16 1553 12/30/16 1918 12/30/16 2249 12/31/16 0010  HGB 13.9  --  11.4*  --   HCT 46.0  --  34.6*  --   PLT 277  --  214  --   CREATININE 2.07* 1.92*  --   --   TROPONINI 0.03* 0.35*  --  1.20*    Estimated Creatinine Clearance: 33.8 mL/min (A) (by C-G formula based on SCr of 1.92 mg/dL (H)).   Medical History: Past Medical History:  Diagnosis Date  . Seizures ([place])    X10 years ago    Medications:  Scheduled:  . chlorhexidine gluconate (MEDLINE KIT)  15 mL Mouth Rinse BID  . heparin  3,600 Units Intravenous Once  . insulin aspart  0-9 Units Subcutaneous Q4H  . ipratropium-albuterol  3 mL Nebulization Q6H  . mouth rinse  15 mL Mouth Rinse QID    Assessment: Patient admitted for seizure d/o w/ status epilepticus and suffered aspiration during intubation. Now has elevated trops up to 1.20 and is being started on heparin drip.  Goal of Therapy:  Heparin level 0.3-0.7 units/ml Monitor platelets by anticoagulation protocol:  Yes   Plan:  Give 3600 units bolus x 1  Will start a heparin drip to 750 units/hr and will check a HL @ 1000. Baseline labs ordered. CBC trending down.  Thank you for this consult.  Tobie Lords 12/31/2016,3:36 AM

## 2017-01-01 ENCOUNTER — Inpatient Hospital Stay: Payer: Medicare Other

## 2017-01-01 DIAGNOSIS — R6520 Severe sepsis without septic shock: Secondary | ICD-10-CM

## 2017-01-01 DIAGNOSIS — R748 Abnormal levels of other serum enzymes: Secondary | ICD-10-CM

## 2017-01-01 LAB — CBC
HEMATOCRIT: 28.9 % — AB (ref 40.0–52.0)
Hemoglobin: 9.5 g/dL — ABNORMAL LOW (ref 13.0–18.0)
MCH: 28.6 pg (ref 26.0–34.0)
MCHC: 33 g/dL (ref 32.0–36.0)
MCV: 86.9 fL (ref 80.0–100.0)
Platelets: 144 10*3/uL — ABNORMAL LOW (ref 150–440)
RBC: 3.33 MIL/uL — ABNORMAL LOW (ref 4.40–5.90)
RDW: 15.3 % — AB (ref 11.5–14.5)
WBC: 11.7 10*3/uL — AB (ref 3.8–10.6)

## 2017-01-01 LAB — COMPREHENSIVE METABOLIC PANEL
ALT: 8 U/L — ABNORMAL LOW (ref 17–63)
AST: 17 U/L (ref 15–41)
Albumin: 2.1 g/dL — ABNORMAL LOW (ref 3.5–5.0)
Alkaline Phosphatase: 60 U/L (ref 38–126)
Anion gap: 4 — ABNORMAL LOW (ref 5–15)
BILIRUBIN TOTAL: 0.4 mg/dL (ref 0.3–1.2)
BUN: 16 mg/dL (ref 6–20)
CO2: 22 mmol/L (ref 22–32)
Calcium: 6.1 mg/dL — CL (ref 8.9–10.3)
Chloride: 115 mmol/L — ABNORMAL HIGH (ref 101–111)
Creatinine, Ser: 1.61 mg/dL — ABNORMAL HIGH (ref 0.61–1.24)
GFR calc Af Amer: 52 mL/min — ABNORMAL LOW (ref >60)
GFR, EST NON AFRICAN AMERICAN: 45 mL/min — AB (ref >60)
Glucose, Bld: 168 mg/dL — ABNORMAL HIGH (ref 65–99)
POTASSIUM: 2.9 mmol/L — AB (ref 3.5–5.1)
Sodium: 141 mmol/L (ref 135–145)
TOTAL PROTEIN: 4 g/dL — AB (ref 6.5–8.1)

## 2017-01-01 LAB — MAGNESIUM
MAGNESIUM: 2.6 mg/dL — AB (ref 1.7–2.4)
Magnesium: 1.2 mg/dL — ABNORMAL LOW (ref 1.7–2.4)

## 2017-01-01 LAB — PHOSPHORUS
PHOSPHORUS: 2.4 mg/dL — AB (ref 2.5–4.6)
Phosphorus: 2.1 mg/dL — ABNORMAL LOW (ref 2.5–4.6)

## 2017-01-01 LAB — URINE CULTURE: Culture: NO GROWTH

## 2017-01-01 LAB — TROPONIN I: TROPONIN I: 0.28 ng/mL — AB (ref <0.03)

## 2017-01-01 LAB — GLUCOSE, CAPILLARY
Glucose-Capillary: 116 mg/dL — ABNORMAL HIGH (ref 65–99)
Glucose-Capillary: 118 mg/dL — ABNORMAL HIGH (ref 65–99)
Glucose-Capillary: 128 mg/dL — ABNORMAL HIGH (ref 65–99)

## 2017-01-01 LAB — ECHOCARDIOGRAM COMPLETE
Height: 64 in
Weight: 2186.96 oz

## 2017-01-01 LAB — HIV ANTIBODY (ROUTINE TESTING W REFLEX): HIV SCREEN 4TH GENERATION: NONREACTIVE

## 2017-01-01 LAB — PROCALCITONIN: PROCALCITONIN: 4.02 ng/mL

## 2017-01-01 LAB — HEPARIN LEVEL (UNFRACTIONATED): HEPARIN UNFRACTIONATED: 0.23 [IU]/mL — AB (ref 0.30–0.70)

## 2017-01-01 LAB — BRAIN NATRIURETIC PEPTIDE: B NATRIURETIC PEPTIDE 5: 560 pg/mL — AB (ref 0.0–100.0)

## 2017-01-01 LAB — POTASSIUM: Potassium: 4.7 mmol/L (ref 3.5–5.1)

## 2017-01-01 MED ORDER — ORAL CARE MOUTH RINSE: 15.0000 mL | Freq: Two times a day (BID) | OROMUCOSAL | Status: DC

## 2017-01-01 MED ORDER — POTASSIUM PHOSPHATES 15 MMOLE/5ML IV SOLN
30.0000 mmol | Freq: Once | INTRAVENOUS | Status: AC
  Administered 2017-01-01: 30 mmol via INTRAVENOUS
  Filled 2017-01-01: qty 10

## 2017-01-01 MED ORDER — FENTANYL CITRATE (PF) 100 MCG/2ML IJ SOLN: 12.5000 ug | INTRAMUSCULAR | Status: DC | PRN

## 2017-01-01 MED ORDER — MAGNESIUM SULFATE 2 GM/50ML IV SOLN
2.0000 g | Freq: Once | INTRAVENOUS | Status: AC
  Administered 2017-01-01: 2 g via INTRAVENOUS
  Filled 2017-01-01: qty 50

## 2017-01-01 MED ORDER — HEPARIN BOLUS VIA INFUSION
2000.0000 [IU] | Freq: Once | INTRAVENOUS | Status: AC
  Administered 2017-01-01: 2000 [IU] via INTRAVENOUS
  Filled 2017-01-01: qty 2000

## 2017-01-01 MED ORDER — ENOXAPARIN SODIUM 40 MG/0.4ML ~~LOC~~ SOLN
40.0000 mg | SUBCUTANEOUS | Status: DC
Start: 2017-01-01 — End: 2017-01-03
  Administered 2017-01-01 – 2017-01-03 (×3): 40 mg via SUBCUTANEOUS
  Filled 2017-01-01 (×3): qty 0.4

## 2017-01-01 MED ORDER — DEXMEDETOMIDINE HCL 200 MCG/2ML IV SOLN
0.4000 ug/kg/h | INTRAVENOUS | Status: DC
  Administered 2017-01-01: 1.2 ug/kg/h via INTRAVENOUS
  Administered 2017-01-01: 1 ug/kg/h via INTRAVENOUS
  Administered 2017-01-01: 1.2 ug/kg/h via INTRAVENOUS
  Filled 2017-01-01 (×4): qty 2

## 2017-01-01 MED ORDER — ATORVASTATIN CALCIUM 20 MG PO TABS
20.0000 mg | ORAL_TABLET | Freq: Every day | ORAL | Status: DC
  Administered 2017-01-02 – 2017-01-03 (×2): 20 mg via ORAL
  Filled 2017-01-01 (×2): qty 1

## 2017-01-01 MED ORDER — HEPARIN BOLUS VIA INFUSION
950.0000 [IU] | Freq: Once | INTRAVENOUS | Status: AC
  Administered 2017-01-01: 950 [IU] via INTRAVENOUS
  Filled 2017-01-01: qty 950

## 2017-01-01 MED ORDER — POTASSIUM CHLORIDE 20 MEQ/15ML (10%) PO SOLN
40.0000 meq | Freq: Once | ORAL | Status: AC
  Administered 2017-01-01: 40 meq
  Filled 2017-01-01: qty 30

## 2017-01-01 MED ORDER — HALOPERIDOL LACTATE 5 MG/ML IJ SOLN
2.5000 mg | Freq: Once | INTRAMUSCULAR | Status: AC
  Administered 2017-01-01: 2.5 mg via INTRAVENOUS
  Filled 2017-01-01: qty 1

## 2017-01-01 MED ORDER — CARVEDILOL 6.25 MG PO TABS
3.1250 mg | ORAL_TABLET | Freq: Two times a day (BID) | ORAL | Status: DC
  Administered 2017-01-02 – 2017-01-03 (×4): 3.125 mg via ORAL
  Filled 2017-01-01 (×4): qty 1

## 2017-01-01 NOTE — Progress Notes (Signed)
eLink Physician-Brief Progress Note Patient Name: Patrick MallStephen G Cottone DOB: 09/20/1955 MRN: [id number]   Date of Service  01/01/2017  HPI/Events of Note  K+ = 2.9, Mg++ = 1.2, PO4--- = 2.1 and Creatinine = 1.61.  eICU Interventions  Will replace K+, Mg++ and PO4---.     Intervention Category Major Interventions: Electrolyte abnormality - evaluation and management  Patrick Patton Patrick Patton 01/01/2017, 5:58 AM

## 2017-01-01 NOTE — Progress Notes (Signed)
Subjective: Patient awaken and alert.  Extubated.  No further seizures noted.  Patient reports has had seizures before with the last being about 1 year ago but was not placed on anticonvulsant therapy.    Objective: Current vital signs: BP 95/68   Pulse 68   Temp 97.8 F (36.6 C) (Oral)   Resp (!) 27   Ht 5\' 4"  (1.626 m)   Wt 64.4 kg (141 lb 15.6 oz)   SpO2 97%   BMI 24.37 kg/m  Vital signs in last 24 hours: Temp:  [97.8 F (36.6 C)-98.6 F (37 C)] 97.8 F (36.6 C) (06/20 0800) Pulse Rate:  [52-77] 68 (06/20 1300) Resp:  [0-42] 27 (06/20 1300) BP: (69-115)/(53-100) 95/68 (06/20 1300) SpO2:  [90 %-100 %] 97 % (06/20 1300) FiO2 (%):  [28 %-60 %] 28 % (06/20 0846) Weight:  [64.4 kg (141 lb 15.6 oz)] 64.4 kg (141 lb 15.6 oz) (06/20 0343)  Intake/Output from previous day: 06/19 0701 - 06/20 0700 In: 3768.9 [I.V.:1992.2; NG/GT:656.7; IV Piggyback:1120] Out: 2124 [Urine:2124] Intake/Output this shift: Total I/O In: 211.9 [I.V.:133.6; NG/GT:28.3; IV Piggyback:50] Out: 350 [Urine:350] Nutritional status: Diet heart healthy/carb modified Room service appropriate? Yes; Fluid consistency: Thin  Neurologic Exam: Mental Status: Alert, oriented to year but not to name.  Speech fluent without evidence of aphasia.  Able to follow 3 step commands without difficulty. Cranial Nerves: II: Discs flat bilaterally; Visual fields grossly normal, pupils equal, round, reactive to light and accommodation III,IV, VI: ptosis not present, extra-ocular motions intact bilaterally V,VII: smile symmetric, facial light touch sensation normal bilaterally VIII: hearing normal bilaterally IX,X: gag reflex present XI: bilateral shoulder shrug XII: midline tongue extension Motor: Right : Upper extremity   5/5    Left:     Upper extremity   5/5  Lower extremity   5/5     Lower extremity   5/5 Tone and bulk:normal tone throughout; no atrophy noted   Lab Results: Basic Metabolic Panel:  Recent Labs Lab  12/30/16 1553 12/30/16 1918 12/31/16 0426 12/31/16 0944 01/01/17 0334  NA 143 139 139  --  141  K 3.9 3.4* 3.7  --  2.9*  CL 104 107 108  --  115*  CO2 13* 20* 24  --  22  GLUCOSE 209* 290* 98  --  168*  BUN 9 8 10   --  16  CREATININE 2.07* 1.92* 2.07*  --  1.61*  CALCIUM 10.3 7.9* 6.8*  --  6.1*  MG 2.4  --   --  1.6* 1.2*  PHOS 5.0*  --   --   --  2.1*    Liver Function Tests:  Recent Labs Lab 12/30/16 1553 12/31/16 0426 01/01/17 0334  AST 26 26 17   ALT 10* 8* 8*  ALKPHOS 126 75 60  BILITOT 0.6 0.7 0.4  PROT 8.5* 4.9* 4.0*  ALBUMIN 4.7 2.6* 2.1*    Recent Labs Lab 12/30/16 1553  LIPASE 26   No results for input(s): AMMONIA in the last 168 hours.  CBC:  Recent Labs Lab 12/30/16 1553 12/30/16 2249 12/31/16 0426 01/01/17 0334  WBC 12.2* 18.0* 18.5* 11.7*  NEUTROABS 8.8* 15.5*  --   --   HGB 13.9 11.4* 12.2* 9.5*  HCT 46.0 34.6* 37.2* 28.9*  MCV 97.8 89.1 89.1 86.9  PLT 277 214 252 144*    Cardiac Enzymes:  Recent Labs Lab 12/30/16 1553 12/30/16 1918 12/31/16 0010 12/31/16 0609 01/01/17 0334  TROPONINI 0.03* 0.35* 1.20* 1.40* 0.28*  Lipid Panel:  Recent Labs Lab 12/30/16 1918 12/31/16 0244  TRIG 169* 104    CBG:  Recent Labs Lab 12/31/16 1609 12/31/16 1929 12/31/16 2356 01/01/17 0313 01/01/17 0730  GLUCAP 143* 121* 118* 128* 116*    Microbiology: Results for orders placed or performed during the hospital encounter of 12/30/16  Culture, blood (routine x 2)     Status: None (Preliminary result)   Collection Time: 12/30/16  4:04 PM  Result Value Ref Range Status   Specimen Description BLOOD LEFT ANTECUBITAL  Final   Special Requests   Final    BOTTLES DRAWN AEROBIC AND ANAEROBIC Blood Culture results may not be optimal due to an excessive volume of blood received in culture bottles   Culture NO GROWTH 2 DAYS  Final   Report Status PENDING  Incomplete  Culture, blood (routine x 2)     Status: None (Preliminary result)    Collection Time: 12/30/16  4:04 PM  Result Value Ref Range Status   Specimen Description BLOOD BLOOD LEFT HAND  Final   Special Requests   Final    BOTTLES DRAWN AEROBIC AND ANAEROBIC Blood Culture adequate volume   Culture NO GROWTH 2 DAYS  Final   Report Status PENDING  Incomplete  MRSA PCR Screening     Status: None   Collection Time: 12/30/16  7:18 PM  Result Value Ref Range Status   MRSA by PCR NEGATIVE NEGATIVE Final    Comment:        The GeneXpert MRSA Assay (FDA approved for NASAL specimens only), is one component of a comprehensive MRSA colonization surveillance program. It is not intended to diagnose MRSA infection nor to guide or monitor treatment for MRSA infections.   Urine culture     Status: None   Collection Time: 12/30/16 10:49 PM  Result Value Ref Range Status   Specimen Description URINE, RANDOM  Final   Special Requests NONE  Final   Culture   Final    NO GROWTH Performed at [facility], [address] [place], [place] [address]    Report Status 01/01/2017 FINAL  Final  Culture, respiratory (NON-Expectorated)     Status: None (Preliminary result)   Collection Time: 12/31/16  9:44 AM  Result Value Ref Range Status   Specimen Description TRACHEAL ASPIRATE  Final   Special Requests NONE  Final   Gram Stain   Final    ABUNDANT WBC PRESENT, PREDOMINANTLY PMN FEW SQUAMOUS EPITHELIAL CELLS PRESENT FEW GRAM POSITIVE COCCI IN PAIRS    Culture   Final    CULTURE REINCUBATED FOR BETTER GROWTH Performed at [facility], [address] [place], [place] [address]    Report Status PENDING  Incomplete    Coagulation Studies:  Recent Labs  12/31/16 0332  LABPROT 17.1*  INR 1.38    Imaging: Dg Abdomen 1 View  Result Date: 12/30/2016 CLINICAL DATA:  Orogastric tube placement EXAM: ABDOMEN - 1 VIEW COMPARISON:  Portable exam 1800 hours compared to 12/24/2011 FINDINGS: Tip of orogastric tube projects over stomach. IVC filter noted at L2-L3.  Numerous sutures in EKG leads project over abdomen. External pacing leads project over inferior chest. Single air-filled loop of mildly distended nonspecific small bowel in LEFT upper quadrant. Small nonobstructing 7 mm RIGHT renal calculus. Bones appear demineralized. Lung bases emphysematous. IMPRESSION: Tip of orogastric tube projects over stomach. Nonobstructing 7 mm RIGHT renal calculus. Single air-filled loop of mildly distended nonspecific small bowel LEFT upper quadrant. Electronically  Signed   By: Ulyses Southward M.D.   On: 12/30/2016 18:37   Ct Head Wo Contrast  Result Date: 12/30/2016 CLINICAL DATA:  Seizure with loss of consciousness scan apparent fall EXAM: CT HEAD WITHOUT CONTRAST CT CERVICAL SPINE WITHOUT CONTRAST TECHNIQUE: Multidetector CT imaging of the head and cervical spine was performed following the standard protocol without intravenous contrast. Multiplanar CT image reconstructions of the cervical spine were also generated. COMPARISON:  Head CT June 02, 2016; cervical spine CT December 23, 2011 FINDINGS: CT HEAD FINDINGS Brain: The ventricles are normal in size and configuration. There is no intracranial mass, hemorrhage, extra-axial fluid collection, or midline shift. There is a prior small lacunar infarct in the mid right cerebellum, stable. There is minimal small vessel disease in the centra semiovale bilaterally. There is no new gray-white compartment lesion. No acute infarct evident. Vascular: No hyperdense vessel. There is calcification in the left carotid siphon. There is calcification in each cavernous carotid artery. Skull: The bony calvarium appears intact. Sinuses/Orbits: Visualized paranasal sinuses are clear. Orbits appear symmetric bilaterally. Other: Mastoid air cells are clear. CT CERVICAL SPINE FINDINGS Alignment: There is no spondylolisthesis. There is slight dextroscoliosis in the cervical region. Skull base and vertebrae: Skull base and craniocervical junction regions  appear normal. There is no demonstrable fracture. No blastic or lytic bone lesions. Soft tissues and spinal canal: Prevertebral soft tissues and predental space regions are normal. No paraspinous lesions. No cord or canal hematoma appreciable. Disc levels: There is moderately severe disc space narrowing at C5-6. There is moderate disc space narrowing at C3-4. There is slightly milder disc space narrowing at C4-5. There is facet hypertrophy at multiple levels. There is exit foraminal narrowing due to bony hypertrophy at C3-4 bilaterally, at C4-5 on the left, and at C5-6 bilaterally. There is mild spinal stenosis at C5-6 due to diffuse disc protrusion and bony hypertrophy. No focal disc extrusion evident. Upper chest: Patient is intubated with the endotracheal tube in the visualized trachea. There is bullous disease in the visualized upper lung regions. No consolidation or pneumothorax evident. Other: There are foci of calcification in each carotid artery. IMPRESSION: CT head: Tiny lacunar infarct mid right cerebellum, stable. Minimal periventricular small vessel disease. No intracranial mass, hemorrhage, or extra-axial fluid collection. No acute infarct. Scattered foci of atherosclerotic vascular calcification. CT cervical spine: No fracture or spondylolisthesis. Multifocal osteoarthritic change. Mild spinal stenosis at C5-6 due to bony hypertrophy and disc protrusion. Foci of carotid artery calcification bilaterally. Emphysematous change noted in the lung apices. Patient intubated. Electronically Signed   By: Bretta Bang III M.D.   On: 12/30/2016 16:56   Ct Cervical Spine Wo Contrast  Result Date: 12/30/2016 CLINICAL DATA:  Seizure with loss of consciousness scan apparent fall EXAM: CT HEAD WITHOUT CONTRAST CT CERVICAL SPINE WITHOUT CONTRAST TECHNIQUE: Multidetector CT imaging of the head and cervical spine was performed following the standard protocol without intravenous contrast. Multiplanar CT image  reconstructions of the cervical spine were also generated. COMPARISON:  Head CT June 02, 2016; cervical spine CT December 23, 2011 FINDINGS: CT HEAD FINDINGS Brain: The ventricles are normal in size and configuration. There is no intracranial mass, hemorrhage, extra-axial fluid collection, or midline shift. There is a prior small lacunar infarct in the mid right cerebellum, stable. There is minimal small vessel disease in the centra semiovale bilaterally. There is no new gray-white compartment lesion. No acute infarct evident. Vascular: No hyperdense vessel. There is calcification in the left carotid siphon. There  is calcification in each cavernous carotid artery. Skull: The bony calvarium appears intact. Sinuses/Orbits: Visualized paranasal sinuses are clear. Orbits appear symmetric bilaterally. Other: Mastoid air cells are clear. CT CERVICAL SPINE FINDINGS Alignment: There is no spondylolisthesis. There is slight dextroscoliosis in the cervical region. Skull base and vertebrae: Skull base and craniocervical junction regions appear normal. There is no demonstrable fracture. No blastic or lytic bone lesions. Soft tissues and spinal canal: Prevertebral soft tissues and predental space regions are normal. No paraspinous lesions. No cord or canal hematoma appreciable. Disc levels: There is moderately severe disc space narrowing at C5-6. There is moderate disc space narrowing at C3-4. There is slightly milder disc space narrowing at C4-5. There is facet hypertrophy at multiple levels. There is exit foraminal narrowing due to bony hypertrophy at C3-4 bilaterally, at C4-5 on the left, and at C5-6 bilaterally. There is mild spinal stenosis at C5-6 due to diffuse disc protrusion and bony hypertrophy. No focal disc extrusion evident. Upper chest: Patient is intubated with the endotracheal tube in the visualized trachea. There is bullous disease in the visualized upper lung regions. No consolidation or pneumothorax evident.  Other: There are foci of calcification in each carotid artery. IMPRESSION: CT head: Tiny lacunar infarct mid right cerebellum, stable. Minimal periventricular small vessel disease. No intracranial mass, hemorrhage, or extra-axial fluid collection. No acute infarct. Scattered foci of atherosclerotic vascular calcification. CT cervical spine: No fracture or spondylolisthesis. Multifocal osteoarthritic change. Mild spinal stenosis at C5-6 due to bony hypertrophy and disc protrusion. Foci of carotid artery calcification bilaterally. Emphysematous change noted in the lung apices. Patient intubated. Electronically Signed   By: Bretta Bang III M.D.   On: 12/30/2016 16:56   Dg Chest Port 1 View  Result Date: 01/01/2017 CLINICAL DATA:  Respiratory failure . EXAM: PORTABLE CHEST 1 VIEW COMPARISON:  12/31/2016 . FINDINGS: Endotracheal tube and NG tube in good anatomic position. Right subclavian line noted coiled in the superior vena cava. Cardiomegaly with mild bilateral interstitial prominence and small bilateral pleural effusions consistent CHF. Basilar atelectasis. No pneumothorax. IMPRESSION: 1. Endotracheal tube and NG tube in stable position. Right subclavian line noted with tip coiled in superior vena cava. 2. Congestive heart failure bilateral from interstitial edema and bilateral pleural effusions. 3.  Basilar atelectasis . Electronically Signed   By: Maisie Fus  Register   On: 01/01/2017 07:29   Dg Chest Port 1 View  Result Date: 12/31/2016 CLINICAL DATA:  Respiratory failure. EXAM: PORTABLE CHEST 1 VIEW COMPARISON:  Yesterday 1800 hour FINDINGS: Endotracheal tube 4.8 cm from the carina. Enteric tube in place, tip below the diaphragm not included in the field of view. Right subclavian central venous catheter tip in the mid proximal SVC. Previous question kink in the distal catheter cannot be assessed due to overlapping monitoring device. Increasing hazy opacity at the lung bases suspicious for pleural  effusions and adjacent atelectasis. Mild enlargement of the cardiac silhouette. Suspect central pulmonary edema. No pneumothorax. Remote bilateral rib fractures. IMPRESSION: 1. Increasing hazy opacity at the lung bases suspicious for increasing pleural effusions and adjacent atelectasis. Mild enlargement of cardiac silhouette. Central pulmonary edema is stable. 2. Stable support apparatus. Previously questioned kink in the distal tip of the left subclavian catheter cannot be assessed due to overlapping monitoring device. Electronically Signed   By: Rubye Oaks M.D.   On: 12/31/2016 04:44   Dg Chest Portable 1 View  Result Date: 12/30/2016 CLINICAL DATA:  62 year old male status post enteric tube placement. EXAM: PORTABLE CHEST  1 VIEW COMPARISON:  Earlier Chest radiograph dated 12/30/2016 FINDINGS: Endotracheal tube approximately 5.3 cm above the carina. There has been interval placement of a right-sided central venous catheter with tip over central SVC. The tip of the catheter appears bent. An enteric tube is seen extending below the diaphragm with side port projecting over the gastric air and tip beyond the inferior margin of the image. There is hyperinflation of the lungs. Diffuse interstitial hazy densities again noted. A small left pleural effusion may be present. There is no pneumothorax. The cardiac silhouette is within normal limits. Transcutaneous pacer again noted. The osseous structures are intact. IMPRESSION: 1. Interval placement of an enteric tube with sideport below the diaphragm and tip beyond the inferior margin of the image. Endotracheal tube remains above the carina. 2. Right-sided central venous catheter with tip over central SVC. The tip of the catheter appears bent. Clinical assessment of the catheter recommended to ensure proper functioning. Go 3. Diffuse hazy densities and interstitial prominence likely representing pulmonary edema. A small left pleural effusion versus atelectasis.  Electronically Signed   By: Elgie Collard M.D.   On: 12/30/2016 18:38   Dg Chest Port 1 View  Result Date: 12/30/2016 CLINICAL DATA:  61 y/o  M; post intubation. EXAM: PORTABLE CHEST 1 VIEW COMPARISON:  06/03/2016 chest radiograph FINDINGS: Stable cardiac silhouette given projection and technique. Endotracheal tube 2 cm from the carina. Transcutaneous pacers noted. Diffuse hazy opacification of lungs interstitial prominence probably representing mild pulmonary edema. No pleural effusion. Bones are unremarkable. IMPRESSION: Endotracheal tube 2 cm from the carina. Hazy opacification of lungs in interstitial prominence probably representing mild pulmonary edema. Electronically Signed   By: Mitzi Hansen M.D.   On: 12/30/2016 16:23    Medications:  I have reviewed the patient's current medications. Scheduled: . aspirin  325 mg Per Tube Daily  . atorvastatin  20 mg Oral q1800  . [START ON 01/02/2017] carvedilol  3.125 mg Oral BID WC  . enoxaparin (LOVENOX) injection  40 mg Subcutaneous Q24H  . ipratropium-albuterol  3 mL Nebulization Q6H  . sodium chloride flush  10-40 mL Intracatheter Q12H    Assessment/Plan: History of head injury and seizures presenting with status epilepticus.  On no antiepileptic medications prior to admission.  Currently on IV Keppra.  Recommendations: 1.  Once able to take po may change Keppra to po at dose of 500mg  BID 2.  Seizure precautions 3.  Patient to follow up with neurology after discharge.     LOS: 2 days   Thana Farr, MD Neurology [phone] 01/01/2017  2:08 PM

## 2017-01-01 NOTE — Progress Notes (Signed)
[facility] Cardiology  SUBJECTIVE: Patient obtunded, intubated, unable to answer questions. Sinus bradycardia at a rate of 54 bpm noted on telemetry.    Vitals:   01/01/17 0545 01/01/17 0600 01/01/17 0615 01/01/17 0630  BP: (!) 69/53 (!) 73/53 (!) 85/77 102/70  Pulse: 61 61 (!) 55 (!) 52  Resp: 19 15 18 19   Temp:      TempSrc:      SpO2: 90% 91% 99% 92%  Weight:      Height:         Intake/Output Summary (Last 24 hours) at 01/01/17 0809 Last data filed at 01/01/17 1610  Gross per 24 hour  Intake          3691.95 ml  Output             2124 ml  Net          1567.95 ml      PHYSICAL EXAM  General: Chronically-ill appearing, intubated, on mechanical ventilator, in no acute distress HEENT:  Normocephalic and atramatic Neck:  No JVD.  Lungs: Normal effort of breathing on mechanical ventilator Heart: Bradycardic . Normal S1 and S2 without gallops or murmurs.  Abdomen: Bowel sounds are positive, abdomen soft  Msk:  Patient lying in bed Extremities: No clubbing, cyanosis or edema.   Neuro: Obtunded, intubated Psych:  Obtunded, intubated   LABS: Basic Metabolic Panel:  Recent Labs  96/04/54 1553  12/31/16 0426 12/31/16 0944 01/01/17 0334  NA 143  < > 139  --  141  K 3.9  < > 3.7  --  2.9*  CL 104  < > 108  --  115*  CO2 13*  < > 24  --  22  GLUCOSE 209*  < > 98  --  168*  BUN 9  < > 10  --  16  CREATININE 2.07*  < > 2.07*  --  1.61*  CALCIUM 10.3  < > 6.8*  --  6.1*  MG 2.4  --   --  1.6* 1.2*  PHOS 5.0*  --   --   --  2.1*  < > = values in this interval not displayed. Liver Function Tests:  Recent Labs  12/31/16 0426 01/01/17 0334  AST 26 17  ALT 8* 8*  ALKPHOS 75 60  BILITOT 0.7 0.4  PROT 4.9* 4.0*  ALBUMIN 2.6* 2.1*    Recent Labs  12/30/16 1553  LIPASE 26   CBC:  Recent Labs  12/30/16 1553 12/30/16 2249 12/31/16 0426 01/01/17 0334  WBC 12.2* 18.0* 18.5* 11.7*  NEUTROABS 8.8* 15.5*  --   --   HGB 13.9 11.4* 12.2* 9.5*  HCT 46.0 34.6* 37.2*  28.9*  MCV 97.8 89.1 89.1 86.9  PLT 277 214 252 144*   Cardiac Enzymes:  Recent Labs  12/31/16 0010 12/31/16 0609 01/01/17 0334  TROPONINI 1.20* 1.40* 0.28*   BNP: Invalid input(s): POCBNP D-Dimer: No results for input(s): DDIMER in the last 72 hours. Hemoglobin A1C: No results for input(s): HGBA1C in the last 72 hours. Fasting Lipid Panel:  Recent Labs  12/31/16 0244  TRIG 104   Thyroid Function Tests:  Recent Labs  12/30/16 1700  TSH 1.631   Anemia Panel: No results for input(s): VITAMINB12, FOLATE, FERRITIN, TIBC, IRON, RETICCTPCT in the last 72 hours.  Dg Abdomen 1 View  Result Date: 12/30/2016 CLINICAL DATA:  Orogastric tube placement EXAM: ABDOMEN - 1 VIEW COMPARISON:  Portable exam 1800 hours compared to 12/24/2011 FINDINGS: Tip of  orogastric tube projects over stomach. IVC filter noted at L2-L3. Numerous sutures in EKG leads project over abdomen. External pacing leads project over inferior chest. Single air-filled loop of mildly distended nonspecific small bowel in LEFT upper quadrant. Small nonobstructing 7 mm RIGHT renal calculus. Bones appear demineralized. Lung bases emphysematous. IMPRESSION: Tip of orogastric tube projects over stomach. Nonobstructing 7 mm RIGHT renal calculus. Single air-filled loop of mildly distended nonspecific small bowel LEFT upper quadrant. Electronically Signed   By: Ulyses Southward M.D.   On: 12/30/2016 18:37   Ct Head Wo Contrast  Result Date: 12/30/2016 CLINICAL DATA:  Seizure with loss of consciousness scan apparent fall EXAM: CT HEAD WITHOUT CONTRAST CT CERVICAL SPINE WITHOUT CONTRAST TECHNIQUE: Multidetector CT imaging of the head and cervical spine was performed following the standard protocol without intravenous contrast. Multiplanar CT image reconstructions of the cervical spine were also generated. COMPARISON:  Head CT June 02, 2016; cervical spine CT December 23, 2011 FINDINGS: CT HEAD FINDINGS Brain: The ventricles are normal in  size and configuration. There is no intracranial mass, hemorrhage, extra-axial fluid collection, or midline shift. There is a prior small lacunar infarct in the mid right cerebellum, stable. There is minimal small vessel disease in the centra semiovale bilaterally. There is no new gray-white compartment lesion. No acute infarct evident. Vascular: No hyperdense vessel. There is calcification in the left carotid siphon. There is calcification in each cavernous carotid artery. Skull: The bony calvarium appears intact. Sinuses/Orbits: Visualized paranasal sinuses are clear. Orbits appear symmetric bilaterally. Other: Mastoid air cells are clear. CT CERVICAL SPINE FINDINGS Alignment: There is no spondylolisthesis. There is slight dextroscoliosis in the cervical region. Skull base and vertebrae: Skull base and craniocervical junction regions appear normal. There is no demonstrable fracture. No blastic or lytic bone lesions. Soft tissues and spinal canal: Prevertebral soft tissues and predental space regions are normal. No paraspinous lesions. No cord or canal hematoma appreciable. Disc levels: There is moderately severe disc space narrowing at C5-6. There is moderate disc space narrowing at C3-4. There is slightly milder disc space narrowing at C4-5. There is facet hypertrophy at multiple levels. There is exit foraminal narrowing due to bony hypertrophy at C3-4 bilaterally, at C4-5 on the left, and at C5-6 bilaterally. There is mild spinal stenosis at C5-6 due to diffuse disc protrusion and bony hypertrophy. No focal disc extrusion evident. Upper chest: Patient is intubated with the endotracheal tube in the visualized trachea. There is bullous disease in the visualized upper lung regions. No consolidation or pneumothorax evident. Other: There are foci of calcification in each carotid artery. IMPRESSION: CT head: Tiny lacunar infarct mid right cerebellum, stable. Minimal periventricular small vessel disease. No  intracranial mass, hemorrhage, or extra-axial fluid collection. No acute infarct. Scattered foci of atherosclerotic vascular calcification. CT cervical spine: No fracture or spondylolisthesis. Multifocal osteoarthritic change. Mild spinal stenosis at C5-6 due to bony hypertrophy and disc protrusion. Foci of carotid artery calcification bilaterally. Emphysematous change noted in the lung apices. Patient intubated. Electronically Signed   By: Bretta Bang III M.D.   On: 12/30/2016 16:56   Ct Cervical Spine Wo Contrast  Result Date: 12/30/2016 CLINICAL DATA:  Seizure with loss of consciousness scan apparent fall EXAM: CT HEAD WITHOUT CONTRAST CT CERVICAL SPINE WITHOUT CONTRAST TECHNIQUE: Multidetector CT imaging of the head and cervical spine was performed following the standard protocol without intravenous contrast. Multiplanar CT image reconstructions of the cervical spine were also generated. COMPARISON:  Head CT June 02, 2016; cervical  spine CT December 23, 2011 FINDINGS: CT HEAD FINDINGS Brain: The ventricles are normal in size and configuration. There is no intracranial mass, hemorrhage, extra-axial fluid collection, or midline shift. There is a prior small lacunar infarct in the mid right cerebellum, stable. There is minimal small vessel disease in the centra semiovale bilaterally. There is no new gray-white compartment lesion. No acute infarct evident. Vascular: No hyperdense vessel. There is calcification in the left carotid siphon. There is calcification in each cavernous carotid artery. Skull: The bony calvarium appears intact. Sinuses/Orbits: Visualized paranasal sinuses are clear. Orbits appear symmetric bilaterally. Other: Mastoid air cells are clear. CT CERVICAL SPINE FINDINGS Alignment: There is no spondylolisthesis. There is slight dextroscoliosis in the cervical region. Skull base and vertebrae: Skull base and craniocervical junction regions appear normal. There is no demonstrable fracture.  No blastic or lytic bone lesions. Soft tissues and spinal canal: Prevertebral soft tissues and predental space regions are normal. No paraspinous lesions. No cord or canal hematoma appreciable. Disc levels: There is moderately severe disc space narrowing at C5-6. There is moderate disc space narrowing at C3-4. There is slightly milder disc space narrowing at C4-5. There is facet hypertrophy at multiple levels. There is exit foraminal narrowing due to bony hypertrophy at C3-4 bilaterally, at C4-5 on the left, and at C5-6 bilaterally. There is mild spinal stenosis at C5-6 due to diffuse disc protrusion and bony hypertrophy. No focal disc extrusion evident. Upper chest: Patient is intubated with the endotracheal tube in the visualized trachea. There is bullous disease in the visualized upper lung regions. No consolidation or pneumothorax evident. Other: There are foci of calcification in each carotid artery. IMPRESSION: CT head: Tiny lacunar infarct mid right cerebellum, stable. Minimal periventricular small vessel disease. No intracranial mass, hemorrhage, or extra-axial fluid collection. No acute infarct. Scattered foci of atherosclerotic vascular calcification. CT cervical spine: No fracture or spondylolisthesis. Multifocal osteoarthritic change. Mild spinal stenosis at C5-6 due to bony hypertrophy and disc protrusion. Foci of carotid artery calcification bilaterally. Emphysematous change noted in the lung apices. Patient intubated. Electronically Signed   By: Bretta BangWilliam  Woodruff III M.D.   On: 12/30/2016 16:56   Dg Chest Port 1 View  Result Date: 01/01/2017 CLINICAL DATA:  Respiratory failure . EXAM: PORTABLE CHEST 1 VIEW COMPARISON:  12/31/2016 . FINDINGS: Endotracheal tube and NG tube in good anatomic position. Right subclavian line noted coiled in the superior vena cava. Cardiomegaly with mild bilateral interstitial prominence and small bilateral pleural effusions consistent CHF. Basilar atelectasis. No  pneumothorax. IMPRESSION: 1. Endotracheal tube and NG tube in stable position. Right subclavian line noted with tip coiled in superior vena cava. 2. Congestive heart failure bilateral from interstitial edema and bilateral pleural effusions. 3.  Basilar atelectasis . Electronically Signed   By: Maisie Fushomas  Register   On: 01/01/2017 07:29   Dg Chest Port 1 View  Result Date: 12/31/2016 CLINICAL DATA:  Respiratory failure. EXAM: PORTABLE CHEST 1 VIEW COMPARISON:  Yesterday 1800 hour FINDINGS: Endotracheal tube 4.8 cm from the carina. Enteric tube in place, tip below the diaphragm not included in the field of view. Right subclavian central venous catheter tip in the mid proximal SVC. Previous question kink in the distal catheter cannot be assessed due to overlapping monitoring device. Increasing hazy opacity at the lung bases suspicious for pleural effusions and adjacent atelectasis. Mild enlargement of the cardiac silhouette. Suspect central pulmonary edema. No pneumothorax. Remote bilateral rib fractures. IMPRESSION: 1. Increasing hazy opacity at the lung bases suspicious for  increasing pleural effusions and adjacent atelectasis. Mild enlargement of cardiac silhouette. Central pulmonary edema is stable. 2. Stable support apparatus. Previously questioned kink in the distal tip of the left subclavian catheter cannot be assessed due to overlapping monitoring device. Electronically Signed   By: Rubye Oaks M.D.   On: 12/31/2016 04:44   Dg Chest Portable 1 View  Result Date: 12/30/2016 CLINICAL DATA:  61 year old male status post enteric tube placement. EXAM: PORTABLE CHEST 1 VIEW COMPARISON:  Earlier Chest radiograph dated 12/30/2016 FINDINGS: Endotracheal tube approximately 5.3 cm above the carina. There has been interval placement of a right-sided central venous catheter with tip over central SVC. The tip of the catheter appears bent. An enteric tube is seen extending below the diaphragm with side port  projecting over the gastric air and tip beyond the inferior margin of the image. There is hyperinflation of the lungs. Diffuse interstitial hazy densities again noted. A small left pleural effusion may be present. There is no pneumothorax. The cardiac silhouette is within normal limits. Transcutaneous pacer again noted. The osseous structures are intact. IMPRESSION: 1. Interval placement of an enteric tube with sideport below the diaphragm and tip beyond the inferior margin of the image. Endotracheal tube remains above the carina. 2. Right-sided central venous catheter with tip over central SVC. The tip of the catheter appears bent. Clinical assessment of the catheter recommended to ensure proper functioning. Go 3. Diffuse hazy densities and interstitial prominence likely representing pulmonary edema. A small left pleural effusion versus atelectasis. Electronically Signed   By: Elgie Collard M.D.   On: 12/30/2016 18:38   Dg Chest Port 1 View  Result Date: 12/30/2016 CLINICAL DATA:  61 y/o  M; post intubation. EXAM: PORTABLE CHEST 1 VIEW COMPARISON:  06/03/2016 chest radiograph FINDINGS: Stable cardiac silhouette given projection and technique. Endotracheal tube 2 cm from the carina. Transcutaneous pacers noted. Diffuse hazy opacification of lungs interstitial prominence probably representing mild pulmonary edema. No pleural effusion. Bones are unremarkable. IMPRESSION: Endotracheal tube 2 cm from the carina. Hazy opacification of lungs in interstitial prominence probably representing mild pulmonary edema. Electronically Signed   By: Mitzi Hansen M.D.   On: 12/30/2016 16:23     Echo: EF 30-35% from 55% in 08/2016  TELEMETRY: Sinus bradycardia, rate 54 bpm  ASSESSMENT AND PLAN:  Active Problems:   Status epilepticus (HCC)    1. Elevated troponin, likely demand supply ischemia in the setting of acute hypoxemic respiratory failure, tachyarrhythmias, and metabolic acidosis. Trended down  from 1.40 to 0.28. 2. LV dysfunction, EF 30-35% as compared to EF of 55% in 08/2016, for unknown etiology 3. Atrial fibrillation with RVR, with no prior history, converted to sinus rhythm with a bolus of amiodarone. 4. Sinus bradycardia 5. Acute hypoxemic respiratory failure with aspiration pneumonia 6. Seizure disorder with recent status epilepticus 7. AKI with CKD  Recommendations: 1. Continue current therapy. 2. Patient to be extubated this morning. 3. Will plan to treat LV dysfunction as blood pressure permits 4. Will plan to repeat echo as outpatient in near future 5. Possible functional study pending patient's initial course.  Patrick Patton 01/01/2017 8:09 AM

## 2017-01-01 NOTE — Plan of Care (Signed)
Problem: Skin Integrity: Goal: Risk for impaired skin integrity will decrease Outcome: Progressing Patient remains free from pressure ulcers. No new skin issues identified.

## 2017-01-01 NOTE — Progress Notes (Signed)
ANTICOAGULATION CONSULT NOTE - Initial Consult  Pharmacy Consult for heparin dosing Indication: chest pain/ACS  Allergies  Allergen Reactions  . Penicillins Anaphylaxis and Swelling    .Has patient had a PCN reaction causing immediate rash, facial/tongue/throat swelling, SOB or lightheadedness with hypotension: Unknown Has patient had a PCN reaction causing severe rash involving mucus membranes or skin necrosis: No Has patient had a PCN reaction that required hospitalization: No Has patient had a PCN reaction occurring within the last 10 years: No If all of the above answers are "NO", then may proceed with Cephalosporin use.   . Codeine Hives    Patient Measurements: Height: _0  (162.6 cm) Weight: 141 lb 15.6 oz (64.4 kg) IBW/kg (Calculated) : 59.2 Heparin Dosing Weight: 60 kg  Vital Signs: Temp: 98.5 F (36.9 C) (06/19 2357) Temp Source: Oral (06/19 2357) BP: 102/70 (06/20 0630) Pulse Rate: 52 (06/20 0630)  Labs:  Recent Labs  12/30/16 1918 12/30/16 2249 12/31/16 0010 12/31/16 0332 12/31/16 0426 12/31/16 0609  12/31/16 1607 12/31/16 2256 01/01/17 0334 01/01/17 0657  HGB  --  11.4*  --   --  12.2*  --   --   --   --  9.5*  --   HCT  --  34.6*  --   --  37.2*  --   --   --   --  28.9*  --   PLT  --  214  --   --  252  --   --   --   --  144*  --   APTT  --   --   --  37*  --   --   --   --   --   --   --   LABPROT  --   --   --  17.1*  --   --   --   --   --   --   --   INR  --   --   --  1.38  --   --   --   --   --   --   --   HEPARINUNFRC  --   --   --  <0.10*  --   --   < > 0.33 0.20*  --  0.23*  CREATININE 1.92*  --   --   --  2.07*  --   --   --   --  1.61*  --   TROPONINI 0.35*  --  1.20*  --   --  1.40*  --   --   --  0.28*  --   < > = values in this interval not displayed.  Estimated Creatinine Clearance: 40.3 mL/min (A) (by C-G formula based on SCr of 1.61 mg/dL (H)).   Medical History: Past Medical History:  Diagnosis Date  . Seizures ([place])    X10 years ago    Medications:  Scheduled:  . aspirin  325 mg Per Tube Daily  . atorvastatin  20 mg Per Tube q1800  . chlorhexidine gluconate (MEDLINE KIT)  15 mL Mouth Rinse BID  . feeding supplement (PRO-STAT SUGAR FREE 64)  30 mL Per Tube BID  . feeding supplement (VITAL HIGH PROTEIN)  1,000 mL Per Tube Q24H  . heparin  2,000 Units Intravenous Once  . insulin aspart  0-9 Units Subcutaneous Q4H  . ipratropium-albuterol  3 mL Nebulization Q6H  . mouth rinse  15 mL Mouth Rinse 10 times per day  .  sodium chloride flush  10-40 mL Intracatheter Q12H    Assessment: Patient admitted for seizure d/o w/ status epilepticus and suffered aspiration during intubation. Now has elevated trops up to 1.20 and is being started on heparin drip.  Goal of Therapy:  Heparin level 0.3-0.7 units/ml Monitor platelets by anticoagulation protocol: Yes   Plan:  Heparin level therapeutic at 0.33, however is trending down. Will increase rate from 750 units/hr to 800 units/hr and recheck in 6 hours.  6/19 @ 2300 HL 0.20 subtherapeutic. Will rebolus w/ heparin 950 units IV x 1 and increase rate to 950 units/hr and recheck HL @ 0700.  6/20 @ 0700 HL 0.23 subtherapeutic. Will rebolus w/ heparin 1000 units IV x 1 and increase the rate to 1050 units/hr and recheck HL @ 1300.  Thank you for this consult.  Tobie Lords, PharmD, BCPS Clinical Pharmacist 01/01/2017

## 2017-01-01 NOTE — Progress Notes (Signed)
ANTICOAGULATION CONSULT NOTE - Initial Consult  Pharmacy Consult for heparin dosing Indication: chest pain/ACS  Allergies  Allergen Reactions  . Penicillins Anaphylaxis and Swelling    .Has patient had a PCN reaction causing immediate rash, facial/tongue/throat swelling, SOB or lightheadedness with hypotension: Unknown Has patient had a PCN reaction causing severe rash involving mucus membranes or skin necrosis: No Has patient had a PCN reaction that required hospitalization: No Has patient had a PCN reaction occurring within the last 10 years: No If all of the above answers are "NO", then may proceed with Cephalosporin use.   . Codeine Hives    Patient Measurements: Height: '5\' 4"'  (162.6 cm) Weight: 136 lb 11 oz (62 kg) IBW/kg (Calculated) : 59.2 Heparin Dosing Weight: 60 kg  Vital Signs: Temp: 98.5 F (36.9 C) (06/19 2357) Temp Source: Oral (06/19 2357) BP: 93/74 (06/20 0000) Pulse Rate: 68 (06/20 0000)  Labs:  Recent Labs  12/30/16 1553 12/30/16 1918 12/30/16 2249 12/31/16 0010  12/31/16 0332 12/31/16 0426 12/31/16 0609 12/31/16 0944 12/31/16 1607 12/31/16 2256  HGB 13.9  --  11.4*  --   --   --  12.2*  --   --   --   --   HCT 46.0  --  34.6*  --   --   --  37.2*  --   --   --   --   PLT 277  --  214  --   --   --  252  --   --   --   --   APTT  --   --   --   --   --  37*  --   --   --   --   --   LABPROT  --   --   --   --   --  17.1*  --   --   --   --   --   INR  --   --   --   --   --  1.38  --   --   --   --   --   HEPARINUNFRC  --   --   --   --   < > <0.10*  --   --  0.41 0.33 0.20*  CREATININE 2.07* 1.92*  --   --   --   --  2.07*  --   --   --   --   TROPONINI 0.03* 0.35*  --  1.20*  --   --   --  1.40*  --   --   --   < > = values in this interval not displayed.  Estimated Creatinine Clearance: 31.4 mL/min (A) (by C-G formula based on SCr of 2.07 mg/dL (H)).   Medical History: Past Medical History:  Diagnosis Date  . Seizures ([place])    X10  years ago    Medications:  Scheduled:  . aspirin  325 mg Per Tube Daily  . atorvastatin  20 mg Per Tube q1800  . chlorhexidine gluconate (MEDLINE KIT)  15 mL Mouth Rinse BID  . feeding supplement (PRO-STAT SUGAR FREE 64)  30 mL Per Tube BID  . feeding supplement (VITAL HIGH PROTEIN)  1,000 mL Per Tube Q24H  . heparin  950 Units Intravenous Once  . insulin aspart  0-9 Units Subcutaneous Q4H  . ipratropium-albuterol  3 mL Nebulization Q6H  . mouth rinse  15 mL Mouth Rinse 10 times per day  .  sodium chloride flush  10-40 mL Intracatheter Q12H    Assessment: Patient admitted for seizure d/o w/ status epilepticus and suffered aspiration during intubation. Now has elevated trops up to 1.20 and is being started on heparin drip.  Goal of Therapy:  Heparin level 0.3-0.7 units/ml Monitor platelets by anticoagulation protocol: Yes   Plan:  Heparin level therapeutic at 0.33, however is trending down. Will increase rate from 750 units/hr to 800 units/hr and recheck in 6 hours.  6/19 @ 2300 HL 0.20 subtherapeutic. Will rebolus w/ heparin 950 units IV x 1 and increase rate to 950 units/hr and recheck HL @ 0700.  Thank you for this consult.  Tobie Lords, PharmD, BCPS Clinical Pharmacist 01/01/2017

## 2017-01-01 NOTE — Progress Notes (Signed)
Pharmacy Antibiotic Note  Patrick Patton is a 61 y.o. male admitted on 12/30/2016 with ?aspiration pneumonia/Witnessed aspiration during intubation. Marland Kitchen.  Pharmacy has been consulted for Meropenem dosing.  Plan: Continue meropenem 1 g iv q 12 hours.   Discussed possible transition to ceftriaxone and metronidazole with Dr. Sung AmabileSimonds due to the theoretical potential of carbapenem to aggravate seizure disorder. Dr. Sung AmabileSimonds wants to continue meropenem for now and narrow based on culture results.    Height: 5\' 4"  (162.6 cm) Weight: 141 lb 15.6 oz (64.4 kg) IBW/kg (Calculated) : 59.2  Temp (24hrs), Avg:98.2 F (36.8 C), Min:97.8 F (36.6 C), Max:98.6 F (37 C)   Recent Labs Lab 12/30/16 1553 12/30/16 1604 12/30/16 1918 12/30/16 1935 12/30/16 2249 12/31/16 0426 01/01/17 0334  WBC 12.2*  --   --   --  18.0* 18.5* 11.7*  CREATININE 2.07*  --  1.92*  --   --  2.07* 1.61*  LATICACIDVEN  --  21.5*  --  5.4*  --   --   --     Estimated Creatinine Clearance: 40.3 mL/min (A) (by C-G formula based on SCr of 1.61 mg/dL (H)).    Allergies  Allergen Reactions  . Penicillins Anaphylaxis and Swelling    .Has patient had a PCN reaction causing immediate rash, facial/tongue/throat swelling, SOB or lightheadedness with hypotension: Unknown Has patient had a PCN reaction causing severe rash involving mucus membranes or skin necrosis: No Has patient had a PCN reaction that required hospitalization: No Has patient had a PCN reaction occurring within the last 10 years: No If all of the above answers are "NO", then may proceed with Cephalosporin use.   . Codeine Hives    Antimicrobials this admission: Vanc x 1 6/18  Meropenem  6/18 >>    Dose adjustments this admission:    Microbiology results: 6/18 BCx: NGTD 6/18 UCx: sent 6/18 Trach aspirate: pending MRSA PCR: negative  Thank you for allowing pharmacy to be a part of this patient's care.  [name], Tadashi Burkel D 01/01/2017 1:31 PM

## 2017-01-01 NOTE — Progress Notes (Signed)
Pt. Extubated and placed on 2 LNC. No apparent distress noted at this time. 

## 2017-01-01 NOTE — Progress Notes (Signed)
PULMONARY / CRITICAL CARE MEDICINE   Name: Patrick Patton MRN: [id number] DOB: 07/30/1955    ADMISSION DATE:  12/30/2016  PT PROFILE:  [age] M with seizure disorder brought to ED by EMS with status epilepticus. Intubated in ED. Witnessed aspiration during intubation. Severe metabolic acidosis, AKI, tachyarrhythmias all noted in the emergency department.  MAJOR EVENTS/TEST RESULTS: 06/18 admitted with status epilepticus as noted above 06/18 CT head: Tiny lacunar infarct mid right cerebellum, stable. No acute infarct noted. 06/18 CT C-spine: No abnormalities noted 06/19 progressive hypotension requiring vasopressors. Chest x-ray with increasing bilateral lower lobe infiltrates. Troponin I elevated at 1.40.  06/19 Cardiology consultation: Recommended discontinuation of heparin. Echocardiogram ordered. 06/19 Neurology consultation: Recommended continuing Keppra. EEG ordered 06/19 Echocardiogram: LVEF 30-35%. Regional wall motion abnormalities cannot be excluded. Grade 1 diastolic dysfunction. 06/19 EEG: abnormal EEG due to burst-suppression activity.  No epileptiform activity is noted.  This finding is consistent with the patient's current medications  INDWELLING DEVICES:: ETT 06/18 >> 06/20 R [place] CVL 06/18 >>   MICRO DATA: MRSA PCR 06/18 >> NEG Urine 06/18 >> NEG Resp 06/18 >>  Blood 06/18 >>   ANTIMICROBIALS:  Meropenem 06/18 >>    SUBJECTIVE:  Passed SBT. Extubated. Tolerating well. Cognition intact.  VITAL SIGNS: BP 94/67   Pulse 74   Temp 97.8 F (36.6 C) (Oral)   Resp 19   Ht 5\' 4"  (1.626 m)   Wt 141 lb 15.6 oz (64.4 kg)   SpO2 93%   BMI 24.37 kg/m   HEMODYNAMICS:    VENTILATOR SETTINGS: Vent Mode: Spontaneous FiO2 (%):  [28 %-60 %] 28 % Set Rate:  [16 bmp] 16 bmp Vt Set:  [500 mL] 500 mL PEEP:  [5 cmH20] 5 cmH20 Pressure Support:  [5 cmH20] 5 cmH20 Plateau Pressure:  [15 cmH20-21 cmH20] 15 cmH20  INTAKE / OUTPUT: I/O last 3 completed shifts: In: 5557.4  [I.V.:3625.7; NG/GT:656.7; IV Piggyback:1275] Out: 2424 [Urine:2424]  PHYSICAL EXAMINATION: General: RASS 0, + F/C Neuro: No focal deficits HEENT: NCAT, sclerae white Cardiovascular: Regular, no murmurs noted  Lungs: Clear anteriorly Abdomen: Midline scar, soft, diminished bowel sounds, NT Extremities: Cool, no edema  LABS:  BMET  Recent Labs Lab 12/30/16 1918 12/31/16 0426 01/01/17 0334  NA 139 139 141  K 3.4* 3.7 2.9*  CL 107 108 115*  CO2 20* 24 22  BUN 8 10 16   CREATININE 1.92* 2.07* 1.61*  GLUCOSE 290* 98 168*    Electrolytes  Recent Labs Lab 12/30/16 1553 12/30/16 1918 12/31/16 0426 12/31/16 0944 01/01/17 0334  CALCIUM 10.3 7.9* 6.8*  --  6.1*  MG 2.4  --   --  1.6* 1.2*  PHOS 5.0*  --   --   --  2.1*    CBC  Recent Labs Lab 12/30/16 2249 12/31/16 0426 01/01/17 0334  WBC 18.0* 18.5* 11.7*  HGB 11.4* 12.2* 9.5*  HCT 34.6* 37.2* 28.9*  PLT 214 252 144*    Coag's  Recent Labs Lab 12/31/16 0332  APTT 37*  INR 1.38    Sepsis Markers  Recent Labs Lab 12/30/16 1604 12/30/16 1918 12/30/16 1935 12/31/16 0426 01/01/17 0334  LATICACIDVEN 21.5*  --  5.4*  --   --   PROCALCITON  --  0.60  --  6.48 4.02    ABG  Recent Labs Lab 12/30/16 1811 12/30/16 2320 12/31/16 0415  PHART 7.21* 7.26* 7.34*  PCO2ART 50* 46 42  PO2ART 62* 95 181*    Liver Enzymes  Recent  Labs Lab 12/30/16 1553 12/31/16 0426 01/01/17 0334  AST 26 26 17   ALT 10* 8* 8*  ALKPHOS 126 75 60  BILITOT 0.6 0.7 0.4  ALBUMIN 4.7 2.6* 2.1*    Cardiac Enzymes  Recent Labs Lab 12/31/16 0010 12/31/16 0609 01/01/17 0334  TROPONINI 1.20* 1.40* 0.28*    Glucose  Recent Labs Lab 12/31/16 1159 12/31/16 1609 12/31/16 1929 12/31/16 2356 01/01/17 0313 01/01/17 0730  GLUCAP 153* 143* 121* 118* 128* 116*    CXR:  Improved right lower lobe aeration. Persistent left lower lobe opacity   ASSESSMENT / PLAN:  PULMONARY A: Acute hypoxemic respiratory  failure Suspected aspiration pneumonia P:   Supplemental oxygen to keep SPO2 greater than 92% Monitor in ICU post extubation  CARDIOVASCULAR A:  Hypertension, resolved Tachycardia, resolved Septic shock, improving Elevated troponin I P:  Continue norepinephrine as needed to maintain MAP goal > 65 mmHg Continue aspirin, atorvastatin  RENAL A:   AKI, resolved CKD - baseline creatinine appears to be 1.7 range Severe lactic acidosis, resolved P:   Monitor BMET intermittently Monitor I/Os Correct electrolytes as indicated  GASTROINTESTINAL A:   Postextubation dysphagia P:   SUP: Not indicated postextubation Initiate diet later today if tolerating extubation  HEMATOLOGIC A:   No issues identified P:  DVT px: Enoxaparin Monitor CBC intermittently Transfuse per usual guidelines   INFECTIOUS A:   Aspiration pneumonia Severe sepsis, resolving P:   Monitor temp, WBC count Micro and abx as above   ENDOCRINE A:   Stress-induced hyperglycemia, resolved P:   Discontinue SSI  NEUROLOGIC A:   Seizure disorder Status epilepticus, resolved Acute encephalopathy, resolved P:   RASS goal: 0 Continue Keppra  Neurology following   FAMILY: Sister updated at bedside   CCM time: 40 min The above time includes time spent in consultation with patient and/or family members and reviewing care plan on multidisciplinary rounds  Billy Fischer, MD PCCM service Mobile [phone] Pager [phone] 01/01/2017, 12:04 PM

## 2017-01-02 ENCOUNTER — Encounter: Payer: Self-pay | Admitting: Internal Medicine

## 2017-01-02 DIAGNOSIS — R569 Unspecified convulsions: Secondary | ICD-10-CM

## 2017-01-02 LAB — BASIC METABOLIC PANEL
ANION GAP: 6 (ref 5–15)
BUN: 20 mg/dL (ref 6–20)
CALCIUM: 7.8 mg/dL — AB (ref 8.9–10.3)
CO2: 26 mmol/L (ref 22–32)
Chloride: 111 mmol/L (ref 101–111)
Creatinine, Ser: 1.98 mg/dL — ABNORMAL HIGH (ref 0.61–1.24)
GFR, EST AFRICAN AMERICAN: 40 mL/min — AB (ref >60)
GFR, EST NON AFRICAN AMERICAN: 35 mL/min — AB (ref >60)
Glucose, Bld: 94 mg/dL (ref 65–99)
Potassium: 4.5 mmol/L (ref 3.5–5.1)
SODIUM: 143 mmol/L (ref 135–145)

## 2017-01-02 LAB — CULTURE, RESPIRATORY W GRAM STAIN

## 2017-01-02 LAB — CBC
HCT: 31.2 % — ABNORMAL LOW (ref 40.0–52.0)
Hemoglobin: 10.6 g/dL — ABNORMAL LOW (ref 13.0–18.0)
MCH: 29.9 pg (ref 26.0–34.0)
MCHC: 34.2 g/dL (ref 32.0–36.0)
MCV: 87.6 fL (ref 80.0–100.0)
PLATELETS: 127 10*3/uL — AB (ref 150–440)
RBC: 3.56 MIL/uL — AB (ref 4.40–5.90)
RDW: 15.3 % — ABNORMAL HIGH (ref 11.5–14.5)
WBC: 5.1 10*3/uL (ref 3.8–10.6)

## 2017-01-02 LAB — CULTURE, RESPIRATORY: CULTURE: NORMAL

## 2017-01-02 MED ORDER — IPRATROPIUM-ALBUTEROL 0.5-2.5 (3) MG/3ML IN SOLN: 3.0000 mL | RESPIRATORY_TRACT | Status: DC | PRN

## 2017-01-02 MED ORDER — LEVETIRACETAM 500 MG PO TABS
500.0000 mg | ORAL_TABLET | Freq: Two times a day (BID) | ORAL | Status: DC
  Administered 2017-01-02 – 2017-01-03 (×3): 500 mg via ORAL
  Filled 2017-01-02 (×4): qty 1

## 2017-01-02 MED ORDER — FLUTICASONE PROPIONATE 50 MCG/ACT NA SUSP
1.0000 | Freq: Every day | NASAL | Status: DC
  Administered 2017-01-02 – 2017-01-03 (×2): 1 via NASAL
  Filled 2017-01-02: qty 16

## 2017-01-02 MED ORDER — LEVOFLOXACIN 250 MG PO TABS: 250.0000 mg | ORAL_TABLET | ORAL | Status: DC

## 2017-01-02 MED ORDER — ALPRAZOLAM 0.25 MG PO TABS: 0.2500 mg | ORAL_TABLET | Freq: Three times a day (TID) | ORAL | Status: DC | PRN

## 2017-01-02 MED ORDER — LEVOFLOXACIN IN D5W 500 MG/100ML IV SOLN
500.0000 mg | Freq: Once | INTRAVENOUS | Status: DC
  Administered 2017-01-02: 500 mg via INTRAVENOUS
  Filled 2017-01-02: qty 100

## 2017-01-02 MED ORDER — LEVOFLOXACIN 500 MG PO TABS
250.0000 mg | ORAL_TABLET | ORAL | Status: DC
  Administered 2017-01-03: 17:00:00 250 mg via ORAL
  Filled 2017-01-02: qty 1

## 2017-01-02 MED ORDER — SALINE SPRAY 0.65 % NA SOLN
1.0000 | NASAL | Status: DC | PRN
  Administered 2017-01-03 (×3): 1 via NASAL
  Filled 2017-01-02: qty 44

## 2017-01-02 MED ORDER — LEVOFLOXACIN 500 MG PO TABS
500.0000 mg | ORAL_TABLET | Freq: Once | ORAL | Status: AC
Start: 2017-01-02 — End: 2017-01-02
  Administered 2017-01-02: 500 mg via ORAL
  Filled 2017-01-02 (×2): qty 1

## 2017-01-02 NOTE — Progress Notes (Signed)
Pharmacy Antibiotic Note  Patrick Patton is a 61 y.o. male admitted on 12/30/2016 with ?aspiration pneumonia/Witnessed aspiration during intubation. Marland Kitchen.  Pharmacy has been consulted for levofloxacin dosing.  Plan: Will continue patient on levofloxacin 500mg  PO x 1 followed by levofloxacin 250mg  PO Q24hr x 3 doses.   Will complete consult at this time. Please reconsult if further pharmacy intervention is warranted.    Height: 5\' 6"  (167.6 cm) Weight: 145 lb 1.6 oz (65.8 kg) IBW/kg (Calculated) : 63.8  Temp (24hrs), Avg:98.2 F (36.8 C), Min:98 F (36.7 C), Max:98.4 F (36.9 C)   Recent Labs Lab 12/30/16 1553 12/30/16 1604 12/30/16 1918 12/30/16 1935 12/30/16 2249 12/31/16 0426 01/01/17 0334 01/02/17 0434  WBC 12.2*  --   --   --  18.0* 18.5* 11.7* 5.1  CREATININE 2.07*  --  1.92*  --   --  2.07* 1.61* 1.98*  LATICACIDVEN  --  21.5*  --  5.4*  --   --   --   --     Estimated Creatinine Clearance: 35.4 mL/min (A) (by C-G formula based on SCr of 1.98 mg/dL (H)).    Allergies  Allergen Reactions  . Penicillins Anaphylaxis and Swelling    .Has patient had a PCN reaction causing immediate rash, facial/tongue/throat swelling, SOB or lightheadedness with hypotension: Unknown Has patient had a PCN reaction causing severe rash involving mucus membranes or skin necrosis: No Has patient had a PCN reaction that required hospitalization: No Has patient had a PCN reaction occurring within the last 10 years: No If all of the above answers are "NO", then may proceed with Cephalosporin use.   . Codeine Hives    Antimicrobials this admission: Vanc x 1 6/18  Meropenem  6/18 >>  6/21 Levofloxacin 6/21 >> 6/24  Dose adjustments this admission: N/A   Microbiology results: 6/18 BCx: no growth x 3 days  6/18 UCx: no growth  6/18 Trach aspirate: re incubated for better growth  MRSA PCR: negative  Thank you for allowing pharmacy to be a part of this patient's  care.  Simpson,Michael L 01/02/2017 2:32 PM

## 2017-01-02 NOTE — Plan of Care (Signed)
Problem: Activity: Goal: Risk for activity intolerance will decrease Outcome: Progressing Patient complains of soreness but is able to move and turn self with moderate to minimal help from nurse.   Problem: Fluid Volume: Goal: Ability to maintain a balanced intake and output will improve Outcome: Progressing Patient urine output is adequate for shift. Patient was also able to take in sips of water without trouble.

## 2017-01-02 NOTE — Progress Notes (Addendum)
PULMONARY / CRITICAL CARE MEDICINE   Name: Patrick MallStephen G Mullenax MRN: [id number] DOB: 09/18/1955    ADMISSION DATE:  12/30/2016  PT PROFILE:  89 M with seizure disorder brought to ED by EMS with status epilepticus. Intubated in ED. Witnessed aspiration during intubation. Severe metabolic acidosis, AKI, tachyarrhythmias all noted in the emergency department.  MAJOR EVENTS/TEST RESULTS: 06/18 admitted with status epilepticus as noted above 06/18 CT head: Tiny lacunar infarct mid right cerebellum, stable. No acute infarct noted. 06/18 CT C-spine: No abnormalities noted 06/19 progressive hypotension requiring vasopressors. Chest x-ray with increasing bilateral lower lobe infiltrates. Troponin I elevated at 1.40.  06/19 Cardiology consultation: Recommended discontinuation of heparin. Echocardiogram ordered. 06/19 Neurology consultation: Recommended continuing Keppra. EEG ordered 06/19 Echocardiogram: LVEF 30-35%. Regional wall motion abnormalities cannot be excluded. Grade 1 diastolic dysfunction. 06/19 EEG: abnormal EEG due to burst-suppression activity.  No epileptiform activity is noted.  This finding is consistent with the patient's current medications 06/20 Extubated. Vasopressors tapered to off 06/21 transfer to MedSurg with telemetry monitoring  INDWELLING DEVICES:: ETT 06/18 >> 06/20 R [place] CVL 06/18 >> 06/20  MICRO DATA: MRSA PCR 06/18 >> NEG Urine 06/18 >> NEG Resp 06/19 >>  Blood 06/18 >> NEG  ANTIMICROBIALS:  Meropenem 06/18 >> 06/21 Levofloxacin 06/21 >> (4 more doses planned)   SUBJECTIVE:  No distress. No complaints.   VITAL SIGNS: BP 107/74   Pulse (!) 26   Temp 98 F (36.7 C) (Oral)   Resp (!) 25   Ht 5\' 4"  (1.626 m)   Wt 134 lb 7.7 oz (61 kg)   SpO2 98%   BMI 23.08 kg/m   HEMODYNAMICS:    VENTILATOR SETTINGS:    INTAKE / OUTPUT: I/O last 3 completed shifts: In: 2797.1 [I.V.:1302.1; NG/GT:280; IV Piggyback:1215] Out: 4175 [Urine:4175]  PHYSICAL  EXAMINATION: General: NAD Neuro: No deficits HEENT: WNL Cardiovascular: Regular, no murmurs Lungs: No adventitious sounds Abdomen: Soft, bowel sounds present Extremities: Warm, no edema  LABS:  BMET  Recent Labs Lab 12/31/16 0426 01/01/17 0334 01/01/17 1919 01/02/17 0434  NA 139 141  --  143  K 3.7 2.9* 4.7 4.5  CL 108 115*  --  111  CO2 24 22  --  26  BUN 10 16  --  20  CREATININE 2.07* 1.61*  --  1.98*  GLUCOSE 98 168*  --  94    Electrolytes  Recent Labs Lab 12/30/16 1553  12/31/16 0426 12/31/16 0944 01/01/17 0334 01/01/17 1919 01/02/17 0434  CALCIUM 10.3  < > 6.8*  --  6.1*  --  7.8*  MG 2.4  --   --  1.6* 1.2* 2.6*  --   PHOS 5.0*  --   --   --  2.1* 2.4*  --   < > = values in this interval not displayed.  CBC  Recent Labs Lab 12/31/16 0426 01/01/17 0334 01/02/17 0434  WBC 18.5* 11.7* 5.1  HGB 12.2* 9.5* 10.6*  HCT 37.2* 28.9* 31.2*  PLT 252 144* 127*    Coag's  Recent Labs Lab 12/31/16 0332  APTT 37*  INR 1.38    Sepsis Markers  Recent Labs Lab 12/30/16 1604 12/30/16 1918 12/30/16 1935 12/31/16 0426 01/01/17 0334  LATICACIDVEN 21.5*  --  5.4*  --   --   PROCALCITON  --  0.60  --  6.48 4.02    ABG  Recent Labs Lab 12/30/16 1811 12/30/16 2320 12/31/16 0415  PHART 7.21* 7.26* 7.34*  PCO2ART 50* 46 42  PO2ART 62* 95 181*    Liver Enzymes  Recent Labs Lab 12/30/16 1553 12/31/16 0426 01/01/17 0334  AST 26 26 17   ALT 10* 8* 8*  ALKPHOS 126 75 60  BILITOT 0.6 0.7 0.4  ALBUMIN 4.7 2.6* 2.1*    Cardiac Enzymes  Recent Labs Lab 12/31/16 0010 12/31/16 0609 01/01/17 0334  TROPONINI 1.20* 1.40* 0.28*    Glucose  Recent Labs Lab 12/31/16 1159 12/31/16 1609 12/31/16 1929 12/31/16 2356 01/01/17 0313 01/01/17 0730  GLUCAP 153* 143* 121* 118* 128* 116*    CXR: No new film   ASSESSMENT / PLAN: Acute hypoxemic respiratory failure Suspected aspiration pneumonia Septic shock, resolved Elevated  troponin I  AKI on CKD - baseline creatinine appears to be 1.7 range Severe lactic acidosis, resolved Aspiration pneumonia Severe sepsis, resolved Stress-induced hyperglycemia, resolved Seizure disorder Status epilepticus, resolved Acute encephalopathy, resolved P: Cont supplemental oxygen to keep SPO2 greater than 92% Continue aspirin, atorvastatin Monitor BMET intermittently Monitor I/Os Correct electrolytes as indicated Advance diet and activity as tolerated Physical therapy consultation 06/21 DVT px: Enoxaparin Monitor CBC intermittently Transfuse per usual guidelines  Monitor temp, WBC count Micro and abx as above  Continue Keppra - changed to by mouth 06/21 Neurology following  Transfer to MedSurg with telemetry 06/21 After transfer, PCCM will sign off. Please call if we can be of further assistance   Billy Fischer, MD PCCM service Mobile [phone] Pager [phone] 01/02/2017, 11:36 AM

## 2017-01-02 NOTE — Progress Notes (Signed)
Patient was admitted by Intensivist on 12/30/16 for status epilepticus, aspiration pneumonia-intubated, had metabolic acidosis, acute renal failure and tachyarrhythmias while in ICU. 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 -Neurology has been consulted and patient on Keppra at this time. Might benefit from an EEG. -Had tachyarrhythmias while in ICU, seen by cardiology. His most recent echocardiogram showed a drop in EF to 30% from 55% 3 months ago.  -cardiology scheduled for Myoview in a.m. -Physical therapy consulted. -Will be on hospitalist service

## 2017-01-02 NOTE — Evaluation (Signed)
Physical Therapy Evaluation Patient Details Name: Patrick Patton MRN: [id number] DOB: [birth year] Today's Date: 01/02/2017   History of Present Illness  34 M with seizure disorder brought to ED by EMS with status epilepticus. Intubated in ED. Witnessed aspiration during intubation. Severe metabolic acidosis, AKI, tachyarrhythmias all noted in the emergency department. Pt was admitted to the ICU and at one point was requiring vasopressors due to progressive hypotension as well as elevated troponin. Echo showed LVEF at 30-35% and EEG abnormal. Pt was extubated and tapered off vasopressors 01/01/17 and transferred onto the floor 01/02/17.   Clinical Impression  Pt admitted with above diagnosis. Pt currently with functional limitations due to the deficits listed below (see PT Problem List).  Pt demonstrates independence with bed mobility and supervision for transfers. He is able to ambulate partially around RN station and back to room. SaO2>95% on supplemental O2. Pt begins with walker but progresses to no assistive device. He is mildly unsteady without assistive device. Mild lateral gait deviation but able to self correct without assistance. Pt would benefit from [facility] PT after discharge to work on his balance and conditioning. At this time he would need a rolling walker for added safety but may not need when it comes closer to discharge. He would also benefit from a fall alert system since he lives alone. Patient and family confirm that if patient had been in his apartment when he had the seizure no one may have found him for a prolonged duration. Pt will benefit from skilled PT services to address deficits in strength, balance, and mobility in order to return to full function at home.     Follow Up Recommendations Home health PT    Equipment Recommendations  Rolling walker with 5" wheels;Other (comment) (fall alert system)    Recommendations for Other Services       Precautions / Restrictions  Precautions Precautions: Fall;Other (comment) (Seizure) Restrictions Weight Bearing Restrictions: No      Mobility  Bed Mobility Overal bed mobility: Independent             General bed mobility comments: Good speed/sequencing with HOB elevated, no use of bed rails  Transfers Overall transfer level: Needs assistance Equipment used: None Transfers: Sit to/from Stand Sit to Stand: Supervision         General transfer comment: Pt demonstrates fair speed and sequencing with transfers. Does not require UE support. Stable in standing without UE support  Ambulation/Gait Ambulation/Gait assistance: Min guard Ambulation Distance (Feet): 120 Feet Assistive device: Rolling walker (2 wheeled) Gait Pattern/deviations: Decreased step length - right;Decreased step length - left Gait velocity: Decreased Gait velocity interpretation: <1.8 ft/sec, indicative of risk for recurrent falls General Gait Details: Pt able to ambulate partially around RN station and back to room. SaO2>95% on supplemental O2. Pt begins with walker but progresses to no assistive device. He is mildly unsteady without assistive device. Mild lateral gait deviation but able to self correct without assistance  Stairs            Wheelchair Mobility    Modified Rankin (Stroke Patients Only)       Balance Overall balance assessment: Needs assistance Sitting-balance support: No upper extremity supported Sitting balance-Leahy Scale: Good     Standing balance support: No upper extremity supported Standing balance-Leahy Scale: Fair Standing balance comment: Able to maintain feet apart/together without UE support. Unable to maintain tandem balance. Single leg balance is 1-2 seconds.  Pertinent Vitals/Pain Pain Assessment: No/denies pain    Home Living Family/patient expects to be discharged to:: Private residence Living Arrangements: Alone Available Help at  Discharge: Family Type of Home: Apartment Home Access: Stairs to enter Entrance Stairs-Rails: Left Entrance Stairs-Number of Steps: 21 Home Layout: One level Home Equipment: Cane - single point (no walker, no BSC or grab bars in shower)      Prior Function Level of Independence: Independent         Comments: Independent ambulation mostly household distances without AD.  Independent with ADLs/IADLs. Pt and family report no falls in the last 12 monhts but medical record indicates multiple falls due to syncope.     Hand Dominance   Dominant Hand: Left    Extremity/Trunk Assessment   Upper Extremity Assessment Upper Extremity Assessment: Overall WFL for tasks assessed    Lower Extremity Assessment Lower Extremity Assessment: Overall WFL for tasks assessed       Communication   Communication: No difficulties  Cognition Arousal/Alertness: Awake/alert Behavior During Therapy: Flat affect Overall Cognitive Status: Impaired/Different from baseline Area of Impairment: Orientation                 Orientation Level: Disoriented to;Time;Situation             General Comments: Disoriented to month, confused regarding situation. Response to questions at time can be delayed or not correct for question      General Comments      Exercises     Assessment/Plan    PT Assessment Patient needs continued PT services  PT Problem List Decreased strength;Decreased activity tolerance;Decreased balance;Decreased safety awareness       PT Treatment Interventions Gait training;DME instruction;Stair training;Functional mobility training;Balance training;Therapeutic activities;Therapeutic exercise;Neuromuscular re-education;Patient/family education;Cognitive remediation    PT Goals (Current goals can be found in the Care Plan section)  Acute Rehab PT Goals Patient Stated Goal: Return to prior function at home PT Goal Formulation: With patient/family Time For Goal  Achievement: 01/16/17 Potential to Achieve Goals: Good    Frequency Min 2X/week   Barriers to discharge Decreased caregiver support Pt lives alone, sister is nearby but does not live with patient    Co-evaluation               AM-PAC PT "6 Clicks" Daily Activity  Outcome Measure Difficulty turning over in bed (including adjusting bedclothes, sheets and blankets)?: None Difficulty moving from lying on back to sitting on the side of the bed? : None Difficulty sitting down on and standing up from a chair with arms (e.g., wheelchair, bedside commode, etc,.)?: None Help needed moving to and from a bed to chair (including a wheelchair)?: None Help needed walking in hospital room?: A Little Help needed climbing 3-5 steps with a railing? : A Little 6 Click Score: 22    End of Session Equipment Utilized During Treatment: Gait belt Activity Tolerance: Patient tolerated treatment well Patient left: in chair;with call bell/phone within reach;with chair alarm set Nurse Communication: Other (comment) (Requests ice walker and coffee) PT Visit Diagnosis: Unsteadiness on feet (R26.81);Muscle weakness (generalized) (M62.81)    Time: [id number] PT Time Calculation (min) (ACUTE ONLY): 29 min   Charges:   PT Evaluation $PT Eval Moderate Complexity: 1 Procedure PT Treatments $Gait Training: 8-22 mins   PT G Codes:   PT G-Codes **NOT FOR INPATIENT CLASS** Functional Assessment Tool Used: AM-PAC 6 Clicks Basic Mobility Functional Limitation: Mobility: Walking and moving around Mobility: Walking and Moving Around Current Status ([id number]):  At least 20 percent but less than 40 percent impaired, limited or restricted Mobility: Walking and Moving Around Goal Status [phone]): At least 1 percent but less than 20 percent impaired, limited or restricted    Lynnea Maizes PT, DPT    Hodan Wurtz 01/02/2017, 3:25 PM

## 2017-01-02 NOTE — Progress Notes (Signed)
Subjective: No further seizures noted.  Continues to be confused.   Objective: Current vital signs: BP 137/76 (BP Location: Left Arm)   Pulse 80   Temp 98.9 F (37.2 C) (Oral)   Resp 16   Ht 5\' 6"  (1.676 m)   Wt 65.8 kg (145 lb 1.6 oz)   SpO2 100%   BMI 23.42 kg/m  Vital signs in last 24 hours: Temp:  [98 F (36.7 C)-98.9 F (37.2 C)] 98.9 F (37.2 C) (06/21 2034) Pulse Rate:  [26-85] 80 (06/21 2034) Resp:  [16-61] 16 (06/21 2034) BP: (88-137)/(56-97) 137/76 (06/21 2034) SpO2:  [87 %-100 %] 100 % (06/21 2034) Weight:  [61 kg (134 lb 7.7 oz)-65.8 kg (145 lb 1.6 oz)] 65.8 kg (145 lb 1.6 oz) (06/21 1311)  Intake/Output from previous day: 06/20 0701 - 06/21 0700 In: 518.5 [I.V.:290.2; NG/GT:28.3; IV Piggyback:200] Out: 2275 [Urine:2275] Intake/Output this shift: No intake/output data recorded. Nutritional status: Diet heart healthy/carb modified Room service appropriate? Yes; Fluid consistency: Thin Diet NPO time specified  Neurologic Exam: Mental Status: Alert.  Still does not know where he is.  Speech fluent without evidence of aphasia.  Able to follow 3 step commands without difficulty. Cranial Nerves: II: Discs flat bilaterally; Visual fields grossly normal, pupils equal, round, reactive to light and accommodation III,IV, VI: ptosis not present, extra-ocular motions intact bilaterally V,VII: smile symmetric, facial light touch sensation normal bilaterally VIII: hearing normal bilaterally IX,X: gag reflex present XI: bilateral shoulder shrug XII: midline tongue extension Motor: Right :  Upper extremity   5/5                                      Left:     Upper extremity   5/5             Lower extremity   5/5                                                  Lower extremity   5/5 Tone and bulk:normal tone throughout; no atrophy noted   Lab Results: Basic Metabolic Panel:  Recent Labs Lab 12/30/16 1553 12/30/16 1918 12/31/16 0426 12/31/16 0944 01/01/17 0334  01/01/17 1919 01/02/17 0434  NA 143 139 139  --  141  --  143  K 3.9 3.4* 3.7  --  2.9* 4.7 4.5  CL 104 107 108  --  115*  --  111  CO2 13* 20* 24  --  22  --  26  GLUCOSE 209* 290* 98  --  168*  --  94  BUN 9 8 10   --  16  --  20  CREATININE 2.07* 1.92* 2.07*  --  1.61*  --  1.98*  CALCIUM 10.3 7.9* 6.8*  --  6.1*  --  7.8*  MG 2.4  --   --  1.6* 1.2* 2.6*  --   PHOS 5.0*  --   --   --  2.1* 2.4*  --     Liver Function Tests:  Recent Labs Lab 12/30/16 1553 12/31/16 0426 01/01/17 0334  AST 26 26 17   ALT 10* 8* 8*  ALKPHOS 126 75 60  BILITOT 0.6 0.7 0.4  PROT 8.5* 4.9* 4.0*  ALBUMIN 4.7 2.6* 2.1*  Recent Labs Lab 12/30/16 1553  LIPASE 26   No results for input(s): AMMONIA in the last 168 hours.  CBC:  Recent Labs Lab 12/30/16 1553 12/30/16 2249 12/31/16 0426 01/01/17 0334 01/02/17 0434  WBC 12.2* 18.0* 18.5* 11.7* 5.1  NEUTROABS 8.8* 15.5*  --   --   --   HGB 13.9 11.4* 12.2* 9.5* 10.6*  HCT 46.0 34.6* 37.2* 28.9* 31.2*  MCV 97.8 89.1 89.1 86.9 87.6  PLT 277 214 252 144* 127*    Cardiac Enzymes:  Recent Labs Lab 12/30/16 1553 12/30/16 1918 12/31/16 0010 12/31/16 0609 01/01/17 0334  TROPONINI 0.03* 0.35* 1.20* 1.40* 0.28*    Lipid Panel:  Recent Labs Lab 12/30/16 1918 12/31/16 0244  TRIG 169* 104    CBG:  Recent Labs Lab 12/31/16 1609 12/31/16 1929 12/31/16 2356 01/01/17 0313 01/01/17 0730  GLUCAP 143* 121* 118* 128* 116*    Microbiology: Results for orders placed or performed during the hospital encounter of 12/30/16  Culture, blood (routine x 2)     Status: None (Preliminary result)   Collection Time: 12/30/16  4:04 PM  Result Value Ref Range Status   Specimen Description BLOOD LEFT ANTECUBITAL  Final   Special Requests   Final    BOTTLES DRAWN AEROBIC AND ANAEROBIC Blood Culture results may not be optimal due to an excessive volume of blood received in culture bottles   Culture NO GROWTH 3 DAYS  Final   Report Status  PENDING  Incomplete  Culture, blood (routine x 2)     Status: None (Preliminary result)   Collection Time: 12/30/16  4:04 PM  Result Value Ref Range Status   Specimen Description BLOOD BLOOD LEFT HAND  Final   Special Requests   Final    BOTTLES DRAWN AEROBIC AND ANAEROBIC Blood Culture adequate volume   Culture NO GROWTH 3 DAYS  Final   Report Status PENDING  Incomplete  MRSA PCR Screening     Status: None   Collection Time: 12/30/16  7:18 PM  Result Value Ref Range Status   MRSA by PCR NEGATIVE NEGATIVE Final    Comment:        The GeneXpert MRSA Assay (FDA approved for NASAL specimens only), is one component of a comprehensive MRSA colonization surveillance program. It is not intended to diagnose MRSA infection nor to guide or monitor treatment for MRSA infections.   Urine culture     Status: None   Collection Time: 12/30/16 10:49 PM  Result Value Ref Range Status   Specimen Description URINE, RANDOM  Final   Special Requests NONE  Final   Culture   Final    NO GROWTH Performed at [facility], [address], [place], [place] [address]    Report Status 01/01/2017 FINAL  Final  Culture, respiratory (NON-Expectorated)     Status: None   Collection Time: 12/31/16  9:44 AM  Result Value Ref Range Status   Specimen Description TRACHEAL ASPIRATE  Final   Special Requests NONE  Final   Gram Stain   Final    ABUNDANT WBC PRESENT, PREDOMINANTLY PMN FEW SQUAMOUS EPITHELIAL CELLS PRESENT FEW GRAM POSITIVE COCCI IN PAIRS    Culture   Final    Consistent with normal respiratory flora. Performed at [facility], [address], [place], [place] [address]    Report Status 01/02/2017 FINAL  Final    Coagulation Studies:  Recent Labs  12/31/16 0332  LABPROT 17.1*  INR 1.38  Imaging: Dg Chest Port 1 View  Result Date: 01/01/2017 CLINICAL DATA:  Respiratory failure . EXAM: PORTABLE CHEST 1 VIEW COMPARISON:  12/31/2016 . FINDINGS: Endotracheal tube and NG  tube in good anatomic position. Right subclavian line noted coiled in the superior vena cava. Cardiomegaly with mild bilateral interstitial prominence and small bilateral pleural effusions consistent CHF. Basilar atelectasis. No pneumothorax. IMPRESSION: 1. Endotracheal tube and NG tube in stable position. Right subclavian line noted with tip coiled in superior vena cava. 2. Congestive heart failure bilateral from interstitial edema and bilateral pleural effusions. 3.  Basilar atelectasis . Electronically Signed   By: Maisie Fus  Register   On: 01/01/2017 07:29    Medications:  I have reviewed the patient's current medications. Scheduled: . aspirin  325 mg Per Tube Daily  . atorvastatin  20 mg Oral q1800  . carvedilol  3.125 mg Oral BID WC  . enoxaparin (LOVENOX) injection  40 mg Subcutaneous Q24H  . fluticasone  1 spray Each Nare Daily  . levETIRAcetam  500 mg Oral BID  . [START ON 01/03/2017] levofloxacin  250 mg Oral Q24H    Assessment/Plan: No further seizures.  Remains confused.  On Maintenance Keppra.    Recommendations: 1.  Continue Keppra 500mg  BID 2.  Continue seizure precautions   LOS: 3 days   Thana Farr, MD Neurology [phone] 01/02/2017  9:07 PM

## 2017-01-02 NOTE — Progress Notes (Signed)
[facility] Cardiology  SUBJECTIVE: The patient denies chest pain or shortness of breath. He was moved to the floor today. Patient states that prior to admission, he had had no chest pain or shortness of breath, and had been fairly active, "speed" walking several miles per day on most days.    Vitals:   01/02/17 1000 01/02/17 1100 01/02/17 1200 01/02/17 1311  BP: 107/74 125/79  (!) 136/97  Pulse: 85 (!) 26  75  Resp: (!) 22 (!) 25 (!) 26 18  Temp:    98 F (36.7 C)  TempSrc:    Oral  SpO2: (!) 87% 98%  98%  Weight:      Height:         Intake/Output Summary (Last 24 hours) at 01/02/17 1329 Last data filed at 01/02/17 1610  Gross per 24 hour  Intake           546.57 ml  Output             1925 ml  Net         -1378.43 ml      PHYSICAL EXAM  General: In no acute distress HEENT:  Normocephalic and atramatic Neck:  No JVD.  Lungs: Clear bilaterally to auscultation, normal effort of breathing Heart: HRRR . Normal S1 and S2 without gallops or murmurs.  Abdomen: Bowel sounds are positive, abdomen soft and non-tender  Msk:  Back normal, sitting upright in bed Extremities: No clubbing, cyanosis or edema.   Neuro: Alert and oriented X 3. Psych:  Good affect, responds appropriately   LABS: Basic Metabolic Panel:  Recent Labs  96/04/54 0334 01/01/17 1919 01/02/17 0434  NA 141  --  143  K 2.9* 4.7 4.5  CL 115*  --  111  CO2 22  --  26  GLUCOSE 168*  --  94  BUN 16  --  20  CREATININE 1.61*  --  1.98*  CALCIUM 6.1*  --  7.8*  MG 1.2* 2.6*  --   PHOS 2.1* 2.4*  --    Liver Function Tests:  Recent Labs  12/31/16 0426 01/01/17 0334  AST 26 17  ALT 8* 8*  ALKPHOS 75 60  BILITOT 0.7 0.4  PROT 4.9* 4.0*  ALBUMIN 2.6* 2.1*    Recent Labs  12/30/16 1553  LIPASE 26   CBC:  Recent Labs  12/30/16 1553 12/30/16 2249  01/01/17 0334 01/02/17 0434  WBC 12.2* 18.0*  < > 11.7* 5.1  NEUTROABS 8.8* 15.5*  --   --   --   HGB 13.9 11.4*  < > 9.5* 10.6*  HCT 46.0 34.6*   < > 28.9* 31.2*  MCV 97.8 89.1  < > 86.9 87.6  PLT 277 214  < > 144* 127*  < > = values in this interval not displayed. Cardiac Enzymes:  Recent Labs  12/31/16 0010 12/31/16 0609 01/01/17 0334  TROPONINI 1.20* 1.40* 0.28*   BNP: Invalid input(s): POCBNP D-Dimer: No results for input(s): DDIMER in the last 72 hours. Hemoglobin A1C: No results for input(s): HGBA1C in the last 72 hours. Fasting Lipid Panel:  Recent Labs  12/31/16 0244  TRIG 104   Thyroid Function Tests:  Recent Labs  12/30/16 1700  TSH 1.631   Anemia Panel: No results for input(s): VITAMINB12, FOLATE, FERRITIN, TIBC, IRON, RETICCTPCT in the last 72 hours.  Dg Chest Port 1 View  Result Date: 01/01/2017 CLINICAL DATA:  Respiratory failure . EXAM: PORTABLE CHEST 1 VIEW COMPARISON:  12/31/2016 .  FINDINGS: Endotracheal tube and NG tube in good anatomic position. Right subclavian line noted coiled in the superior vena cava. Cardiomegaly with mild bilateral interstitial prominence and small bilateral pleural effusions consistent CHF. Basilar atelectasis. No pneumothorax. IMPRESSION: 1. Endotracheal tube and NG tube in stable position. Right subclavian line noted with tip coiled in superior vena cava. 2. Congestive heart failure bilateral from interstitial edema and bilateral pleural effusions. 3.  Basilar atelectasis . Electronically Signed   By: Maisie Fushomas  Register   On: 01/01/2017 07:29     Echo EF 30-35%  TELEMETRY: Sinus rhyhm, 77 bpm  ASSESSMENT AND PLAN:  Active Problems:   Status epilepticus (HCC)    1. Elevated troponin, likely demand supply ischemia in the setting of acute hypoxemic respiratory failure, aspiration pneumonia, status epilepticus, tachyarrhythmias, and metabolic acidosis. Troponin trended down from 1.40 to 0.28. 2. LV dysfunction, EF 30-35% as compared to EF of 55% in 08/2016, for unknown etiology. Patient denies having had chest pain prior to admission. He reports being active on a daily  basis. 3. Atrial fibrillation with RVR, with no prior history, converted to sinus rhythm with a bolus of amiodarone. 4. Sinus bradycardia, resolved, tolerating carvedilol 5. Acute hypoxemic respiratory failure with suspected aspiration pneumonia 6. Seizure disorder with recent status epilepticus  Recommendations: 1. Continue current therapy. 2. Lexiscan Myoview in the morning 3. If patient does well today and tomorrow, clear to be discharged from a cardiovascular perspective this weekend with follow-up with Dr. Lady GaryFath next week.  4. Pending results of Lexiscan, may need right and left heart catheterization as outpatient to further assess cardiomyopathy.   Sign off for now; call with any questions.  Leanora Ivanoffnna Daun Rens, PA-C  01/02/2017 1:29 PM

## 2017-01-03 LAB — NM MYOCAR MULTI W/SPECT W/WALL MOTION / EF
CHL CUP STRESS STAGE 2 GRADE: 0 %
CHL CUP STRESS STAGE 2 HR: 73 {beats}/min
CHL CUP STRESS STAGE 3 SPEED: 0 mph
CHL CUP STRESS STAGE 4 GRADE: 0 %
CHL CUP STRESS STAGE 4 SPEED: 0 mph
CHL CUP STRESS STAGE 5 HR: 86 {beats}/min
CHL CUP STRESS STAGE 5 SBP: 112 mmHg
CSEPED: 1 min
Estimated workload: 1 METS
Exercise duration (sec): 1 s
LVDIAVOL: 103 mL (ref 62–150)
LVSYSVOL: 44 mL
MPHR: 159 {beats}/min
Peak HR: 100 {beats}/min
Percent HR: 63 %
Percent of predicted max HR: 62 %
Rest HR: 75 {beats}/min
SDS: 0
SRS: 8
SSS: 0
Stage 1 HR: 73 {beats}/min
Stage 2 Speed: 0 mph
Stage 3 Grade: 0 %
Stage 3 HR: 100 {beats}/min
Stage 4 HR: 91 {beats}/min
Stage 5 DBP: 65 mmHg
Stage 5 Grade: 0 %
Stage 5 Speed: 0 mph
TID: 0.93

## 2017-01-03 LAB — CBC
HEMATOCRIT: 28.3 % — AB (ref 40.0–52.0)
HEMOGLOBIN: 9.7 g/dL — AB (ref 13.0–18.0)
MCH: 30 pg (ref 26.0–34.0)
MCHC: 34.1 g/dL (ref 32.0–36.0)
MCV: 88.1 fL (ref 80.0–100.0)
Platelets: 140 10*3/uL — ABNORMAL LOW (ref 150–440)
RBC: 3.22 MIL/uL — ABNORMAL LOW (ref 4.40–5.90)
RDW: 15.1 % — ABNORMAL HIGH (ref 11.5–14.5)
WBC: 4.5 10*3/uL (ref 3.8–10.6)

## 2017-01-03 LAB — BASIC METABOLIC PANEL
ANION GAP: 6 (ref 5–15)
BUN: 18 mg/dL (ref 6–20)
CO2: 25 mmol/L (ref 22–32)
Calcium: 8 mg/dL — ABNORMAL LOW (ref 8.9–10.3)
Chloride: 110 mmol/L (ref 101–111)
Creatinine, Ser: 1.89 mg/dL — ABNORMAL HIGH (ref 0.61–1.24)
GFR calc Af Amer: 43 mL/min — ABNORMAL LOW (ref >60)
GFR calc non Af Amer: 37 mL/min — ABNORMAL LOW (ref >60)
GLUCOSE: 89 mg/dL (ref 65–99)
POTASSIUM: 3.7 mmol/L (ref 3.5–5.1)
SODIUM: 141 mmol/L (ref 135–145)

## 2017-01-03 MED ORDER — CARVEDILOL 3.125 MG PO TABS: 3.1250 mg | ORAL_TABLET | Freq: Two times a day (BID) | ORAL | 1 refills | Status: AC

## 2017-01-03 MED ORDER — TECHNETIUM TC 99M TETROFOSMIN IV KIT
31.0800 | PACK | Freq: Once | INTRAVENOUS | Status: AC | PRN
  Administered 2017-01-03: 09:00:00 31.08 via INTRAVENOUS

## 2017-01-03 MED ORDER — REGADENOSON 0.4 MG/5ML IV SOLN
0.4000 mg | Freq: Once | INTRAVENOUS | Status: AC
  Administered 2017-01-03: 0.4 mg via INTRAVENOUS

## 2017-01-03 MED ORDER — TECHNETIUM TC 99M TETROFOSMIN IV KIT
13.0000 | PACK | Freq: Once | INTRAVENOUS | Status: AC | PRN
  Administered 2017-01-03: 12.3 via INTRAVENOUS

## 2017-01-03 MED ORDER — LEVOFLOXACIN 250 MG PO TABS: 250.0000 mg | ORAL_TABLET | ORAL | 0 refills | Status: AC

## 2017-01-03 MED ORDER — LEVETIRACETAM 500 MG PO TABS: 500.0000 mg | ORAL_TABLET | Freq: Two times a day (BID) | ORAL | 0 refills | Status: DC

## 2017-01-03 MED ORDER — ATORVASTATIN CALCIUM 20 MG PO TABS: 20.0000 mg | ORAL_TABLET | Freq: Every day | ORAL | 1 refills | Status: AC

## 2017-01-03 NOTE — Care Management (Signed)
Admitted to [facility] with the diagnosis of status epilepticus. Lives alone. Son is Patrick MuirJamie [phone]). Had an appointment with Dr. Romeo AppleHarrison for yesterday, but was in hospital. Prescriptions are filled at [facility] [place]. [facility] home health. [facility] in the past. No home oxygen. Uses a cane to aid in ambulation. Takes care of all basic activities of daily living himself, doesn't drive, family helps with errands. No falls. Good appetite. Son will transport Physical therapy evaluation completed. Recommending home with Home Health and physical therapy. Chose [facility] Possible discharge today per Dr. Enedina FinnerSona Patel Gwenette GreetBrenda S Destanee Bedonie RN MSN CCM Care Management [phone]

## 2017-01-03 NOTE — Discharge Summary (Signed)
[facility] [place] [facility]   PATIENT NAME: Patrick Patton    MR#:  [id number]  DATE OF BIRTH:  March 08, 1956  DATE OF ADMISSION:  12/30/2016 ADMITTING PHYSICIAN: Enid Baas, MD  DATE OF DISCHARGE: 01/03/17  PRIMARY CARE PHYSICIAN: Lissa Morales, MD    ADMISSION DIAGNOSIS:  Respiratory failure (HCC) [J96.90] Seizures (HCC) [R56.9] Status epilepticus (HCC) [G40.901]  DISCHARGE DIAGNOSIS:  Acute hypoxic respiratory failure in the setting of seizures Aspiration pneumonia Seizures Cardiomyopathy with EF 30%  SECONDARY DIAGNOSIS:   Past Medical History:  Diagnosis Date  . Alcohol abuse   . Seizures (HCC)    X10 years ago    HOSPITAL COURSE:  89 M with seizure disorder brought to ED by EMS with status epilepticus. Intubated in ED. Witnessed aspiration during intubation  1. Acute hypoxemic respiratory failure with Suspected aspiration pneumonia -extubated and now on RA -levaquin to be given for 7 days  2.Septic shock, resolved  3. Elevated troponin I appears demand ischemia with above -cardiology recommends myoview which was neg except mild low EF -cont asa -f/u as out pt  4. AKI on CKD - baseline creatinine appears to be 1.7 range -received IVF  5.Severe lactic acidosis, resolved  6. Seizure disorder/Status epilepticus, resolved -now on Keppra per neuro recommendation  7.Acute encephalopathy, resolved  Seen by PT recommends HHPT---arranged D/c home CONSULTS OBTAINED:  Treatment Team:  Thana Farr, MD Marcina Millard, MD Kym Groom, MD  DRUG ALLERGIES:   Allergies  Allergen Reactions  . Penicillins Anaphylaxis and Swelling    .Has patient had a PCN reaction causing immediate rash, facial/tongue/throat swelling, SOB or lightheadedness with hypotension: Unknown Has patient had a PCN reaction causing severe rash involving mucus membranes or skin necrosis: No Has patient had a PCN reaction that  required hospitalization: No Has patient had a PCN reaction occurring within the last 10 years: No If all of the above answers are "NO", then may proceed with Cephalosporin use.   . Codeine Hives    DISCHARGE MEDICATIONS:   Current Discharge Medication List    START taking these medications   Details  atorvastatin (LIPITOR) 20 MG tablet Take 1 tablet (20 mg total) by mouth daily at 6 PM. Qty: 30 tablet, Refills: 1    carvedilol (COREG) 3.125 MG tablet Take 1 tablet (3.125 mg total) by mouth 2 (two) times daily with a meal. Qty: 60 tablet, Refills: 1    levETIRAcetam (KEPPRA) 500 MG tablet Take 1 tablet (500 mg total) by mouth 2 (two) times daily. Qty: 60 tablet, Refills: 0    levofloxacin (LEVAQUIN) 250 MG tablet Take 1 tablet (250 mg total) by mouth daily. Qty: 5 tablet, Refills: 0      CONTINUE these medications which have NOT CHANGED   Details  ALPRAZolam (XANAX) 0.5 MG tablet Take 1 tablet (0.5 mg total) by mouth 2 (two) times daily. Qty: 15 tablet, Refills: 0    aspirin EC 81 MG EC tablet Take 1 tablet (81 mg total) by mouth daily.    cholecalciferol (VITAMIN D) 1000 units tablet Take 2,000 Units by mouth daily.    ketoconazole (NIZORAL) 2 % cream Apply 1 application topically daily. Refills: 11    Multiple Vitamin (MULTIVITAMIN WITH MINERALS) TABS tablet Take 1 tablet by mouth daily.    thiamine 100 MG tablet Take 1 tablet (100 mg total) by mouth daily.    feeding supplement, ENSURE ENLIVE, (ENSURE ENLIVE) LIQD Take 237 mLs by mouth 3 (three) times  daily. Qty: 237 mL, Refills: 12    pantoprazole (PROTONIX) 40 MG tablet Take 1 tablet (40 mg total) by mouth 2 (two) times daily. Qty: 60 tablet, Refills: 0    rosuvastatin (CRESTOR) 20 MG tablet Take 1 tablet (20 mg total) by mouth daily. Qty: 30 tablet, Refills: 0      STOP taking these medications     Oxycodone HCl 10 MG TABS         If you experience worsening of your admission symptoms, develop  shortness of breath, life threatening emergency, suicidal or homicidal thoughts you must seek medical attention immediately by calling 911 or calling your MD immediately  if symptoms less severe.  You Must read complete instructions/literature along with all the possible adverse reactions/side effects for all the Medicines you take and that have been prescribed to you. Take any new Medicines after you have completely understood and accept all the possible adverse reactions/side effects.   Please note  You were cared for by a hospitalist during your hospital stay. If you have any questions about your discharge medications or the care you received while you were in the hospital after you are discharged, you can call the unit and asked to speak with the hospitalist on call if the hospitalist that took care of you is not available. Once you are discharged, your primary care physician will handle any further medical issues. Please note that NO REFILLS for any discharge medications will be authorized once you are discharged, as it is imperative that you return to your primary care physician (or establish a relationship with a primary care physician if you do not have one) for your aftercare needs so that they can reassess your need for medications and monitor your lab values. Today   SUBJECTIVE   Doing well  VITAL SIGNS:  Blood pressure (!) 145/91, pulse 65, temperature 98.6 F (37 C), temperature source Oral, resp. rate 18, height 5\' 6"  (1.676 m), weight 65.8 kg (145 lb 1.6 oz), SpO2 98 %.  I/O:   Intake/Output Summary (Last 24 hours) at 01/03/17 1607 Last data filed at 01/03/17 1350  Gross per 24 hour  Intake              600 ml  Output                0 ml  Net              600 ml    PHYSICAL EXAMINATION:  GENERAL:  61 y.o.-year-old patient lying in the bed with no acute distress.  EYES: Pupils equal, round, reactive to light and accommodation. No scleral icterus. Extraocular muscles intact.   HEENT: Head atraumatic, normocephalic. Oropharynx and nasopharynx clear.  NECK:  Supple, no jugular venous distention. No thyroid enlargement, no tenderness.  LUNGS: Normal breath sounds bilaterally, no wheezing, rales,rhonchi or crepitation. No use of accessory muscles of respiration.  CARDIOVASCULAR: S1, S2 normal. No murmurs, rubs, or gallops.  ABDOMEN: Soft, non-tender, non-distended. Bowel sounds present. No organomegaly or mass.  EXTREMITIES: No pedal edema, cyanosis, or clubbing.  NEUROLOGIC: Cranial nerves II through XII are intact. Muscle strength 5/5 in all extremities. Sensation intact. Gait not checked.  PSYCHIATRIC: The patient is alert and oriented x 3.  SKIN: No obvious rash, lesion, or ulcer.   DATA REVIEW:   CBC   Recent Labs Lab 01/03/17 0627  WBC 4.5  HGB 9.7*  HCT 28.3*  PLT 140*    Chemistries   Recent Labs  Lab 01/01/17 0334 01/01/17 1919  01/03/17 0627  NA 141  --   < > 141  K 2.9* 4.7  < > 3.7  CL 115*  --   < > 110  CO2 22  --   < > 25  GLUCOSE 168*  --   < > 89  BUN 16  --   < > 18  CREATININE 1.61*  --   < > 1.89*  CALCIUM 6.1*  --   < > 8.0*  MG 1.2* 2.6*  --   --   AST 17  --   --   --   ALT 8*  --   --   --   ALKPHOS 60  --   --   --   BILITOT 0.4  --   --   --   < > = values in this interval not displayed.  Microbiology Results   Recent Results (from the past 240 hour(s))  Culture, blood (routine x 2)     Status: None (Preliminary result)   Collection Time: 12/30/16  4:04 PM  Result Value Ref Range Status   Specimen Description BLOOD LEFT ANTECUBITAL  Final   Special Requests   Final    BOTTLES DRAWN AEROBIC AND ANAEROBIC Blood Culture results may not be optimal due to an excessive volume of blood received in culture bottles   Culture NO GROWTH 4 DAYS  Final   Report Status PENDING  Incomplete  Culture, blood (routine x 2)     Status: None (Preliminary result)   Collection Time: 12/30/16  4:04 PM  Result Value Ref Range Status    Specimen Description BLOOD BLOOD LEFT HAND  Final   Special Requests   Final    BOTTLES DRAWN AEROBIC AND ANAEROBIC Blood Culture adequate volume   Culture NO GROWTH 4 DAYS  Final   Report Status PENDING  Incomplete  MRSA PCR Screening     Status: None   Collection Time: 12/30/16  7:18 PM  Result Value Ref Range Status   MRSA by PCR NEGATIVE NEGATIVE Final    Comment:        The GeneXpert MRSA Assay (FDA approved for NASAL specimens only), is one component of a comprehensive MRSA colonization surveillance program. It is not intended to diagnose MRSA infection nor to guide or monitor treatment for MRSA infections.   Urine culture     Status: None   Collection Time: 12/30/16 10:49 PM  Result Value Ref Range Status   Specimen Description URINE, RANDOM  Final   Special Requests NONE  Final   Culture   Final    NO GROWTH Performed at [facility], [address], [place], [place] [address]    Report Status 01/01/2017 FINAL  Final  Culture, respiratory (NON-Expectorated)     Status: None   Collection Time: 12/31/16  9:44 AM  Result Value Ref Range Status   Specimen Description TRACHEAL ASPIRATE  Final   Special Requests NONE  Final   Gram Stain   Final    ABUNDANT WBC PRESENT, PREDOMINANTLY PMN FEW SQUAMOUS EPITHELIAL CELLS PRESENT FEW GRAM POSITIVE COCCI IN PAIRS    Culture   Final    Consistent with normal respiratory flora. Performed at [facility], [address], [place], [place] [address]    Report Status 01/02/2017 FINAL  Final    RADIOLOGY:  No results found.   Management plans discussed with the patient, family and they are in  agreement.  CODE STATUS:     Code Status Orders        Start     Ordered   12/30/16 1716  Full code  Continuous     12/30/16 1722    Code Status History    Date Active Date Inactive Code Status Order ID Comments User Context   05/31/2016  6:54 PM 06/04/2016  7:22 PM Full Code [id number]  Gracelyn Nurse, MD  Inpatient      TOTAL TIME TAKING CARE OF THIS PATIENT: *40* minutes.    Jannessa Ogden M.D on 01/03/2017 at 4:07 PM  Between 7am to 6pm - Pager - [phone] After 6pm go to [email or web address] - Social research officer, government  [facility]  Office  [phone]  CC: Primary care physician; Lissa Morales, MD

## 2017-01-03 NOTE — Plan of Care (Signed)
Pt d/ced home.  Stress test ok per Dr. Juliann Paresallwood.  Reviewed d/c instructions and f/u appts w/pt.  IVs removed by Nurse Tech.  Patient will be transported home by sister.

## 2017-01-03 NOTE — Clinical Social Work Note (Signed)
Clinical Social Work Assessment  Patient Details  Name: Patrick Patton MRN: [id number] Date of Birth: [birth year]  Date of referral:  01/03/17               Reason for consult:  Other (Comment Required), Family Concerns, Financial Concerns                Permission sought to share information with:    Permission granted to share information::     Name::        Agency::     Relationship::     Contact Information:     Housing/Transportation Living arrangements for the past 2 months:  Apartment Source of Information:  Patient Patient Interpreter Needed:  None Criminal Activity/Legal Involvement Pertinent to Current Situation/Hospitalization:  No - Comment as needed Significant Relationships:  Adult Children, Other Family Members Lives with:  Self Do you feel safe going back to the place where you live?  Yes Need for family participation in patient care:  No (Coment)  Care giving concerns:  No care giving concerns identified.   Social Worker assessment / plan:  CSW met with pt to address consult for concerns of hospital bill and living situation. CSW introduced herself and explained role of social work. CSW also explained nature of the consult. Pt lives alone in a second floor apartment. Pt has a supportive son and his ex-wife is involved. CSW explained that pt's insurance is going to be billed and then what is not covered will be billed to the patient. CSW shared that pt needs to contact the hospital if he is unable to pay. CSW also inquired about safety at home. Pt stated that he feels safe returning home and is able to manage the stairs to the apartment. PT is recommending HHPT. CSW requested that pt have a home health social worker added to the home health team. RNCM following for disposition needs. Pt may be discharged today and will have son provide transportation. CSW is signing off as no further needs identified.   Employment status:  Disabled (Comment on whether or not currently  receiving Disability) Insurance information:  Managed Medicare PT Recommendations:  Home with [place] / Referral to community resources:  Other (Comment Required) [facility] Social Worker requested)  Patient/Family's Response to care:  Pt was appreciative of CSW support.   Patient/Family's Understanding of and Emotional Response to Diagnosis, Current Treatment, and Prognosis:  Pt is agreeable to have home health services follow.   Emotional Assessment Appearance:  Appears stated age Attitude/Demeanor/Rapport:  Other (appropriate) Affect (typically observed):  Accepting, Adaptable Orientation:  Oriented to Self, Oriented to Place, Oriented to  Time, Oriented to Situation Alcohol / Substance use:  Not Applicable Psych involvement (Current and /or in the community):  No (Comment)  Discharge Needs  Concerns to be addressed:  Adjustment to Illness Readmission within the last 30 days:  No Current discharge risk:  Chronically ill Barriers to Discharge:  Continued Medical Work up   [employer], LCSW 01/03/2017, 11:16 AM

## 2017-01-03 NOTE — Plan of Care (Signed)
Problem: Education: Goal: Knowledge of [facility] Education information/materials will improve Outcome: Progressing Pt alert and oriented. No c/o pain nor distress noted. NPO after midnight for Stress-test. Sailing nose spray for congestion with some improvement. Up to the bathroom with one assist. Continue to monitor

## 2017-01-03 NOTE — Care Management Important Message (Signed)
Important Message  Patient Details  Name: Tresea MallStephen G Pippins MRN: [id number] Date of Birth: 06/05/1956   Medicare Important Message Given:  Yes    Gwenette GreetBrenda S Rashida Ladouceur, RN 01/03/2017, 8:14 AM

## 2017-01-04 LAB — CULTURE, BLOOD (ROUTINE X 2)
Culture: NO GROWTH
Culture: NO GROWTH
SPECIAL REQUESTS: ADEQUATE

## 2017-01-17 NOTE — Progress Notes (Signed)
[facility]  Patient Status: refused services. After explaining services and each discipline the patient states " I appreciate that ya'll are concerned about me however I have a grandson that something is wrong with him and they can't get any help and it pisses me off."  I apologized for the situation with his grand son and explained that the our services are covered by his insurance at that time he refused all home health services.  Called MD that was provided on referral to let him know of refusal, this was the wrong information and was not his primary MD it was a hospitalist, called spoke back with Mr. Oswaldo DoneVincent and got the name of his PCP I called Dr. Sherrie SportHeffington's office and spoke with Gearldine BienenstockBrandy to inform her that we did not admit patient due to refusal of services. Notified Terrilee CroakBrenda Holland, CM & Collie SiadAngela Johnson, CM.     Dimple CaseyJason E Hinton 01/17/2017, 9:37 AM

## 2017-03-12 DIAGNOSIS — I429 Cardiomyopathy, unspecified: Secondary | ICD-10-CM | POA: Insufficient documentation

## 2017-09-30 ENCOUNTER — Encounter: Payer: Self-pay | Admitting: Student in an Organized Health Care Education/Training Program

## 2017-09-30 ENCOUNTER — Ambulatory Visit
Payer: Medicare Other | Attending: Student in an Organized Health Care Education/Training Program | Admitting: Student in an Organized Health Care Education/Training Program

## 2017-09-30 ENCOUNTER — Other Ambulatory Visit: Payer: Self-pay

## 2017-09-30 VITALS — BP 132/99 | HR 79 | Temp 98.2°F | Resp 16 | Ht 68.5 in | Wt 215.0 lb

## 2017-09-30 DIAGNOSIS — F1011 Alcohol abuse, in remission: Secondary | ICD-10-CM | POA: Insufficient documentation

## 2017-09-30 DIAGNOSIS — Z885 Allergy status to narcotic agent status: Secondary | ICD-10-CM | POA: Insufficient documentation

## 2017-09-30 DIAGNOSIS — F419 Anxiety disorder, unspecified: Secondary | ICD-10-CM | POA: Diagnosis not present

## 2017-09-30 DIAGNOSIS — F17210 Nicotine dependence, cigarettes, uncomplicated: Secondary | ICD-10-CM | POA: Diagnosis not present

## 2017-09-30 DIAGNOSIS — G894 Chronic pain syndrome: Secondary | ICD-10-CM | POA: Insufficient documentation

## 2017-09-30 DIAGNOSIS — F329 Major depressive disorder, single episode, unspecified: Secondary | ICD-10-CM | POA: Insufficient documentation

## 2017-09-30 DIAGNOSIS — F1910 Other psychoactive substance abuse, uncomplicated: Secondary | ICD-10-CM | POA: Insufficient documentation

## 2017-09-30 DIAGNOSIS — F32A Depression, unspecified: Secondary | ICD-10-CM

## 2017-09-30 DIAGNOSIS — Z880 Allergy status to penicillin: Secondary | ICD-10-CM | POA: Diagnosis not present

## 2017-09-30 DIAGNOSIS — Z9889 Other specified postprocedural states: Secondary | ICD-10-CM | POA: Diagnosis not present

## 2017-09-30 DIAGNOSIS — M5441 Lumbago with sciatica, right side: Secondary | ICD-10-CM | POA: Diagnosis not present

## 2017-09-30 DIAGNOSIS — Z87898 Personal history of other specified conditions: Secondary | ICD-10-CM | POA: Diagnosis not present

## 2017-09-30 DIAGNOSIS — G8929 Other chronic pain: Secondary | ICD-10-CM | POA: Diagnosis not present

## 2017-09-30 DIAGNOSIS — M5442 Lumbago with sciatica, left side: Secondary | ICD-10-CM | POA: Insufficient documentation

## 2017-09-30 DIAGNOSIS — S2241XD Multiple fractures of ribs, right side, subsequent encounter for fracture with routine healing: Secondary | ICD-10-CM | POA: Insufficient documentation

## 2017-09-30 DIAGNOSIS — S22009D Unspecified fracture of unspecified thoracic vertebra, subsequent encounter for fracture with routine healing: Secondary | ICD-10-CM | POA: Insufficient documentation
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
## 2017-09-30 NOTE — Progress Notes (Signed)
Safety precautions to be maintained throughout the outpatient stay will include: orient to surroundings, keep bed in low position, maintain call bell within reach at all times, provide assistance with transfer out of bed and ambulation.  

## 2018-07-30 ENCOUNTER — Encounter: Payer: Self-pay | Admitting: Emergency Medicine

## 2018-07-30 ENCOUNTER — Emergency Department
Admission: EM | Admit: 2018-07-30 | Discharge: 2018-07-30 | Disposition: A | Discharge status: Home / Self Care | Payer: Medicare Other | Attending: Emergency Medicine | Admitting: Emergency Medicine

## 2018-07-30 ENCOUNTER — Emergency Department: Payer: Medicare Other

## 2018-07-30 ENCOUNTER — Other Ambulatory Visit: Payer: Self-pay

## 2018-07-30 DIAGNOSIS — F17210 Nicotine dependence, cigarettes, uncomplicated: Secondary | ICD-10-CM | POA: Insufficient documentation

## 2018-07-30 DIAGNOSIS — W01190A Fall on same level from slipping, tripping and stumbling with subsequent striking against furniture, initial encounter: Secondary | ICD-10-CM | POA: Diagnosis not present

## 2018-07-30 DIAGNOSIS — Z79899 Other long term (current) drug therapy: Secondary | ICD-10-CM | POA: Diagnosis not present

## 2018-07-30 DIAGNOSIS — Y92009 Unspecified place in unspecified non-institutional (private) residence as the place of occurrence of the external cause: Secondary | ICD-10-CM | POA: Insufficient documentation

## 2018-07-30 DIAGNOSIS — S2242XA Multiple fractures of ribs, left side, initial encounter for closed fracture: Secondary | ICD-10-CM | POA: Diagnosis not present

## 2018-07-30 DIAGNOSIS — Y999 Unspecified external cause status: Secondary | ICD-10-CM | POA: Diagnosis not present

## 2018-07-30 DIAGNOSIS — N183 Chronic kidney disease, stage 3 (moderate): Secondary | ICD-10-CM | POA: Insufficient documentation

## 2018-07-30 DIAGNOSIS — Y939 Activity, unspecified: Secondary | ICD-10-CM | POA: Diagnosis not present

## 2018-07-30 DIAGNOSIS — Z7982 Long term (current) use of aspirin: Secondary | ICD-10-CM | POA: Diagnosis not present

## 2018-07-30 DIAGNOSIS — I129 Hypertensive chronic kidney disease with stage 1 through stage 4 chronic kidney disease, or unspecified chronic kidney disease: Secondary | ICD-10-CM | POA: Insufficient documentation

## 2018-07-30 DIAGNOSIS — R079 Chest pain, unspecified: Secondary | ICD-10-CM | POA: Diagnosis present

## 2018-07-30 LAB — CBC
HEMATOCRIT: 38.1 % — AB (ref 39.0–52.0)
HEMOGLOBIN: 12.1 g/dL — AB (ref 13.0–17.0)
MCH: 30.1 pg (ref 26.0–34.0)
MCHC: 31.8 g/dL (ref 30.0–36.0)
MCV: 94.8 fL (ref 80.0–100.0)
NRBC: 0 % (ref 0.0–0.2)
Platelets: 147 10*3/uL — ABNORMAL LOW (ref 150–400)
RBC: 4.02 MIL/uL — AB (ref 4.22–5.81)
RDW: 15.7 % — ABNORMAL HIGH (ref 11.5–15.5)
WBC: 5.9 10*3/uL (ref 4.0–10.5)

## 2018-07-30 LAB — BASIC METABOLIC PANEL
ANION GAP: 9 (ref 5–15)
BUN: 14 mg/dL (ref 8–23)
CALCIUM: 8.4 mg/dL — AB (ref 8.9–10.3)
CHLORIDE: 106 mmol/L (ref 98–111)
CO2: 18 mmol/L — AB (ref 22–32)
Creatinine, Ser: 1.75 mg/dL — ABNORMAL HIGH (ref 0.61–1.24)
GFR calc Af Amer: 47 mL/min — ABNORMAL LOW (ref >60)
GFR calc non Af Amer: 41 mL/min — ABNORMAL LOW (ref >60)
Glucose, Bld: 178 mg/dL — ABNORMAL HIGH (ref 70–99)
POTASSIUM: 4 mmol/L (ref 3.5–5.1)
Sodium: 133 mmol/L — ABNORMAL LOW (ref 135–145)

## 2018-07-30 MED ORDER — OXYCODONE HCL 5 MG PO TABS
5.0000 mg | ORAL_TABLET | Freq: Four times a day (QID) | ORAL | 0 refills | Status: AC | PRN
Start: 2018-07-30 — End: ?

## 2018-07-30 MED ORDER — OXYCODONE HCL 5 MG PO TABS
10.0000 mg | ORAL_TABLET | ORAL | Status: AC
  Administered 2018-07-30: 10 mg via ORAL
  Filled 2018-07-30: qty 2

## 2018-07-30 MED ORDER — AZITHROMYCIN 250 MG PO TABS: ORAL_TABLET | ORAL | 0 refills | Status: AC

## 2018-07-30 NOTE — ED Notes (Signed)
Provided training on use of incentive spirometer to patient including demonstration and teach back. Pt demonstrated appropriate understanding of use and verbalized understanding of 15 minute intervals and documenting progress.

## 2018-07-30 NOTE — Discharge Instructions (Signed)
°  Follow up with your doctor within 1-2 days.  If you develop any new or worsening symptoms, including but not limited to fever, persistent vomiting, worsening shortness of breath, or other symptoms that concern you, please return to the Emergency Department immediately.   No driving today or while taking oxycodone. Use ONLY as prescribed.

## 2018-07-30 NOTE — ED Triage Notes (Signed)
Pt reports tripped over a hump and fell last night and thinks he broke some ribs. Pt c/o pain to left chest, states it is hard to breath and he feels bad.
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
## 2018-10-08 ENCOUNTER — Emergency Department
Admission: EM | Admit: 2018-10-08 | Discharge: 2018-10-09 | Disposition: A | Discharge status: Home / Self Care | Payer: Medicare Other | Attending: Emergency Medicine | Admitting: Emergency Medicine

## 2018-10-08 DIAGNOSIS — I252 Old myocardial infarction: Secondary | ICD-10-CM | POA: Diagnosis not present

## 2018-10-08 DIAGNOSIS — R569 Unspecified convulsions: Secondary | ICD-10-CM | POA: Insufficient documentation

## 2018-10-08 DIAGNOSIS — N183 Chronic kidney disease, stage 3 (moderate): Secondary | ICD-10-CM | POA: Insufficient documentation

## 2018-10-08 DIAGNOSIS — F17210 Nicotine dependence, cigarettes, uncomplicated: Secondary | ICD-10-CM | POA: Diagnosis not present

## 2018-10-08 DIAGNOSIS — Z79899 Other long term (current) drug therapy: Secondary | ICD-10-CM | POA: Insufficient documentation

## 2018-10-08 DIAGNOSIS — Z7982 Long term (current) use of aspirin: Secondary | ICD-10-CM | POA: Diagnosis not present

## 2018-10-08 LAB — CBC WITH DIFFERENTIAL/PLATELET
ABS IMMATURE GRANULOCYTES: 0.02 10*3/uL (ref 0.00–0.07)
Basophils Absolute: 0.1 10*3/uL (ref 0.0–0.1)
Basophils Relative: 1 %
EOS ABS: 0.1 10*3/uL (ref 0.0–0.5)
Eosinophils Relative: 1 %
HEMATOCRIT: 31 % — AB (ref 39.0–52.0)
Hemoglobin: 10.2 g/dL — ABNORMAL LOW (ref 13.0–17.0)
IMMATURE GRANULOCYTES: 0 %
LYMPHS ABS: 1.2 10*3/uL (ref 0.7–4.0)
Lymphocytes Relative: 20 %
MCH: 32.1 pg (ref 26.0–34.0)
MCHC: 32.9 g/dL (ref 30.0–36.0)
MCV: 97.5 fL (ref 80.0–100.0)
MONOS PCT: 8 %
Monocytes Absolute: 0.5 10*3/uL (ref 0.1–1.0)
NEUTROS ABS: 4.2 10*3/uL (ref 1.7–7.7)
NEUTROS PCT: 70 %
Platelets: 110 10*3/uL — ABNORMAL LOW (ref 150–400)
RBC: 3.18 MIL/uL — ABNORMAL LOW (ref 4.22–5.81)
RDW: 14.2 % (ref 11.5–15.5)
WBC: 6.1 10*3/uL (ref 4.0–10.5)
nRBC: 0 % (ref 0.0–0.2)

## 2018-10-08 LAB — COMPREHENSIVE METABOLIC PANEL
ALT: 20 U/L (ref 0–44)
AST: 37 U/L (ref 15–41)
Albumin: 3.8 g/dL (ref 3.5–5.0)
Alkaline Phosphatase: 71 U/L (ref 38–126)
Anion gap: 15 (ref 5–15)
BILIRUBIN TOTAL: 1.8 mg/dL — AB (ref 0.3–1.2)
BUN: 14 mg/dL (ref 8–23)
CHLORIDE: 108 mmol/L (ref 98–111)
CO2: 12 mmol/L — ABNORMAL LOW (ref 22–32)
CREATININE: 2.04 mg/dL — AB (ref 0.61–1.24)
Calcium: 8 mg/dL — ABNORMAL LOW (ref 8.9–10.3)
GFR calc Af Amer: 39 mL/min — ABNORMAL LOW (ref >60)
GFR calc non Af Amer: 34 mL/min — ABNORMAL LOW (ref >60)
Glucose, Bld: 172 mg/dL — ABNORMAL HIGH (ref 70–99)
Potassium: 3.3 mmol/L — ABNORMAL LOW (ref 3.5–5.1)
Sodium: 135 mmol/L (ref 135–145)
Total Protein: 7 g/dL (ref 6.5–8.1)

## 2018-10-08 LAB — ETHANOL

## 2018-10-08 MED ORDER — ONDANSETRON HCL 4 MG/2ML IJ SOLN
4.0000 mg | Freq: Once | INTRAMUSCULAR | Status: AC
  Administered 2018-10-08: 4 mg via INTRAVENOUS
  Filled 2018-10-08: qty 2

## 2018-10-08 MED ORDER — CHLORDIAZEPOXIDE HCL 25 MG PO CAPS
25.0000 mg | ORAL_CAPSULE | Freq: Once | ORAL | Status: AC
  Administered 2018-10-08: 25 mg via ORAL
  Filled 2018-10-08: qty 1

## 2018-10-08 MED ORDER — LEVETIRACETAM 500 MG PO TABS: 500.0000 mg | ORAL_TABLET | Freq: Two times a day (BID) | ORAL | 3 refills | Status: AC

## 2018-10-08 MED ORDER — SODIUM CHLORIDE 0.9 % IV BOLUS
1000.0000 mL | Freq: Once | INTRAVENOUS | Status: AC
  Administered 2018-10-08: 1000 mL via INTRAVENOUS

## 2018-10-08 MED ORDER — LEVETIRACETAM IN NACL 1000 MG/100ML IV SOLN
1000.0000 mg | Freq: Once | INTRAVENOUS | Status: AC
  Administered 2018-10-08: 1000 mg via INTRAVENOUS
  Filled 2018-10-08: qty 100

## 2018-10-08 MED ORDER — HYDROXYZINE HCL 25 MG PO TABS: 25.0000 mg | ORAL_TABLET | Freq: Three times a day (TID) | ORAL | 0 refills | Status: AC | PRN
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
## 2018-10-08 NOTE — ED Triage Notes (Signed)
Pt here from , with report of seizure /etoh withdraw .

## 2018-12-21 ENCOUNTER — Other Ambulatory Visit: Payer: Self-pay

## 2018-12-21 ENCOUNTER — Emergency Department
Admission: EM | Admit: 2018-12-21 | Discharge: 2018-12-21 | Disposition: A | Discharge status: Home / Self Care | Payer: Medicare Other | Attending: Emergency Medicine | Admitting: Emergency Medicine

## 2018-12-21 DIAGNOSIS — T50901A Poisoning by unspecified drugs, medicaments and biological substances, accidental (unintentional), initial encounter: Secondary | ICD-10-CM | POA: Diagnosis present

## 2018-12-21 DIAGNOSIS — N289 Disorder of kidney and ureter, unspecified: Secondary | ICD-10-CM | POA: Diagnosis not present

## 2018-12-21 DIAGNOSIS — N183 Chronic kidney disease, stage 3 (moderate): Secondary | ICD-10-CM | POA: Insufficient documentation

## 2018-12-21 DIAGNOSIS — T401X1A Poisoning by heroin, accidental (unintentional), initial encounter: Secondary | ICD-10-CM | POA: Insufficient documentation

## 2018-12-21 DIAGNOSIS — Z880 Allergy status to penicillin: Secondary | ICD-10-CM | POA: Insufficient documentation

## 2018-12-21 DIAGNOSIS — Z7982 Long term (current) use of aspirin: Secondary | ICD-10-CM | POA: Insufficient documentation

## 2018-12-21 DIAGNOSIS — F17210 Nicotine dependence, cigarettes, uncomplicated: Secondary | ICD-10-CM | POA: Diagnosis not present

## 2018-12-21 DIAGNOSIS — Z79899 Other long term (current) drug therapy: Secondary | ICD-10-CM | POA: Insufficient documentation

## 2018-12-21 LAB — BASIC METABOLIC PANEL
Anion gap: 6 (ref 5–15)
BUN: 16 mg/dL (ref 8–23)
CO2: 19 mmol/L — ABNORMAL LOW (ref 22–32)
Calcium: 7.2 mg/dL — ABNORMAL LOW (ref 8.9–10.3)
Chloride: 111 mmol/L (ref 98–111)
Creatinine, Ser: 3.02 mg/dL — ABNORMAL HIGH (ref 0.61–1.24)
GFR calc Af Amer: 24 mL/min — ABNORMAL LOW (ref >60)
GFR calc non Af Amer: 21 mL/min — ABNORMAL LOW (ref >60)
Glucose, Bld: 90 mg/dL (ref 70–99)
Potassium: 4.3 mmol/L (ref 3.5–5.1)
Sodium: 136 mmol/L (ref 135–145)

## 2018-12-21 LAB — COMPREHENSIVE METABOLIC PANEL
ALT: 11 U/L (ref 0–44)
AST: 15 U/L (ref 15–41)
Albumin: 3.7 g/dL (ref 3.5–5.0)
Alkaline Phosphatase: 154 U/L — ABNORMAL HIGH (ref 38–126)
Anion gap: 8 (ref 5–15)
BUN: 17 mg/dL (ref 8–23)
CO2: 20 mmol/L — ABNORMAL LOW (ref 22–32)
Calcium: 8.2 mg/dL — ABNORMAL LOW (ref 8.9–10.3)
Chloride: 107 mmol/L (ref 98–111)
Creatinine, Ser: 3.73 mg/dL — ABNORMAL HIGH (ref 0.61–1.24)
GFR calc Af Amer: 19 mL/min — ABNORMAL LOW (ref >60)
GFR calc non Af Amer: 16 mL/min — ABNORMAL LOW (ref >60)
Glucose, Bld: 166 mg/dL — ABNORMAL HIGH (ref 70–99)
Potassium: 4.5 mmol/L (ref 3.5–5.1)
Sodium: 135 mmol/L (ref 135–145)
Total Bilirubin: 0.6 mg/dL (ref 0.3–1.2)
Total Protein: 7 g/dL (ref 6.5–8.1)

## 2018-12-21 LAB — CBC
HCT: 39.6 % (ref 39.0–52.0)
Hemoglobin: 12.4 g/dL — ABNORMAL LOW (ref 13.0–17.0)
MCH: 29.2 pg (ref 26.0–34.0)
MCHC: 31.3 g/dL (ref 30.0–36.0)
MCV: 93.2 fL (ref 80.0–100.0)
Platelets: 193 10*3/uL (ref 150–400)
RBC: 4.25 MIL/uL (ref 4.22–5.81)
RDW: 13.2 % (ref 11.5–15.5)
WBC: 9.4 10*3/uL (ref 4.0–10.5)
nRBC: 0 % (ref 0.0–0.2)

## 2018-12-21 LAB — ETHANOL: Alcohol, Ethyl (B): <10 mg/dL (ref <10)

## 2018-12-21 MED ORDER — SODIUM CHLORIDE 0.9 % IV BOLUS
1000.0000 mL | Freq: Once | INTRAVENOUS | Status: AC
  Administered 2018-12-21: 1000 mL via INTRAVENOUS

## 2018-12-21 MED ORDER — ONDANSETRON HCL 4 MG/2ML IJ SOLN
4.0000 mg | Freq: Once | INTRAMUSCULAR | Status: AC
  Administered 2018-12-21: 4 mg via INTRAVENOUS
  Filled 2018-12-21: qty 2
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
## 2018-12-21 NOTE — ED Notes (Signed)
PT  VOL °

## 2018-12-21 NOTE — ED Notes (Signed)
Dr. Paduchowski at bedside.  

## 2018-12-21 NOTE — Discharge Instructions (Addendum)
You have been seen in the emergency department following an accidental heroin overdose.  Please avoid heroin/opiates in the future.  You have also been found to have renal insufficiency likely due to dehydration.  Please drink plenty of fluids.  Please follow-up with your doctor in 2 to 3 days for recheck of your labs including kidney function.  Return to the emergency department for any symptoms personally concerning to yourself.

## 2018-12-21 NOTE — ED Triage Notes (Addendum)
Pt arrives via [facility] for possible heroin and oxy overdose. Pt reports he was not trying to kill himself. Per EMS pt was found unresponsive by his landlord. Was given 1mg  narcan nasally by EMS. Pt reports his right knee has been hurting and he has been drinking alcohol to the deal with the pain however this was not helping so he used heroin for the pain. PT asked for something for pain, will make MD aware but options limited due to heroin overdose

## 2018-12-21 NOTE — ED Notes (Signed)
Repeat BMP drawn and sent

## 2018-12-21 NOTE — ED Notes (Signed)
Report given to Gracie, RN 

## 2018-12-21 NOTE — ED Notes (Signed)
Pt asleep at this time, equal and unlabored breathing noted. NAD

## 2018-12-21 NOTE — ED Notes (Signed)
I asked pt if he would like me to contact a family member to give an update and he states no

## 2019-01-13 DEATH — deceased

## 2020-05-30 IMAGING — CT CT CHEST W/O CM
2 of 3 series · 15 of 36 positions shown, 18 images · non-contrast
Comparison: Chest x-ray earlier today.

CLINICAL DATA: Fall.  Left chest pain.

EXAM:
CT CHEST WITHOUT CONTRAST
TECHNIQUE: Multidetector CT imaging of the chest was performed following the
standard protocol without IV contrast.

[Series 4: thorax · axial · 0.60mm/px · z∈[+633,+891]mm · 12 of 153 slices shown, 15 images]
[im 12/153  mediastinal]
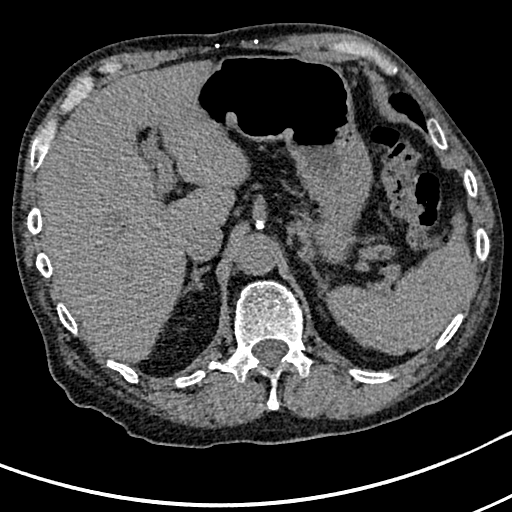
[im 12/153  lung]
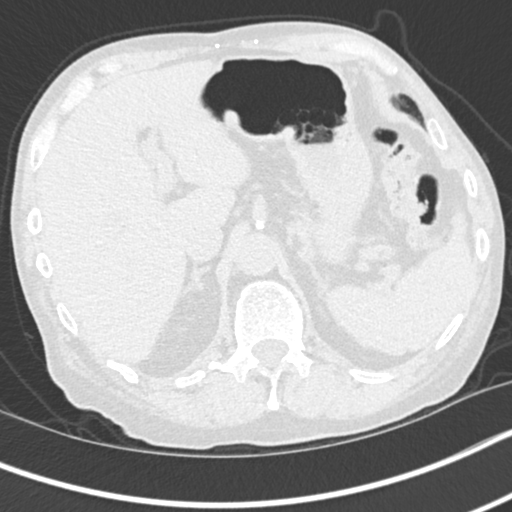
[im 23/153  lung]
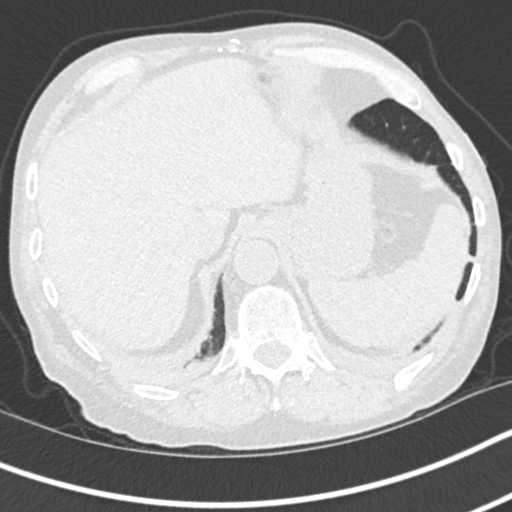
[im 34/153  lung]
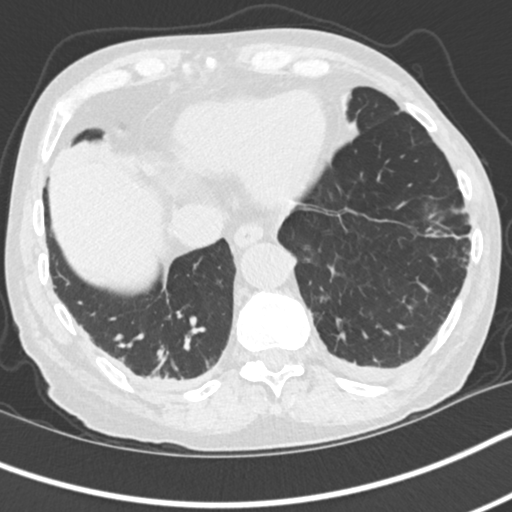
[im 46/153  lung]
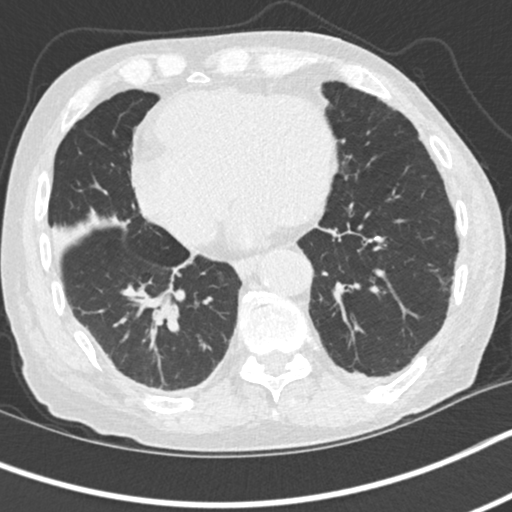
[im 57/153  mediastinal]
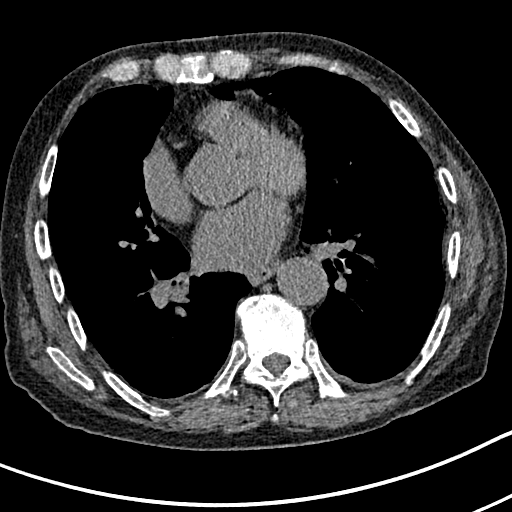
[im 57/153  lung]
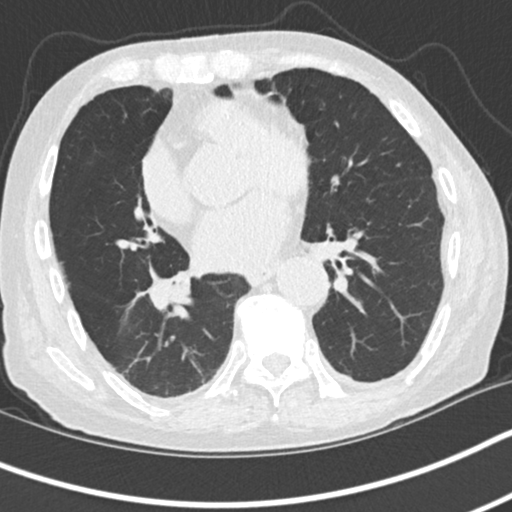
[im 68/153  lung]
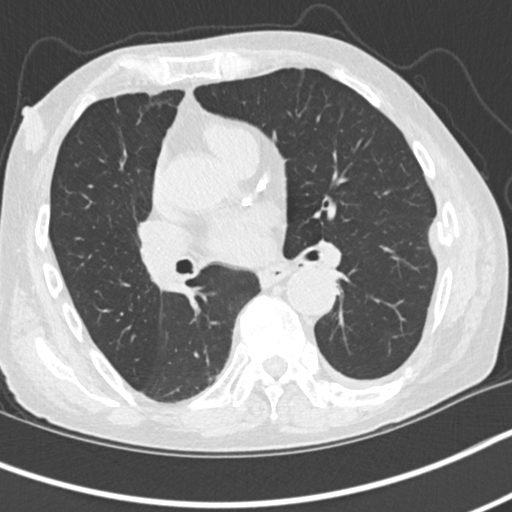
[im 85/153  lung]
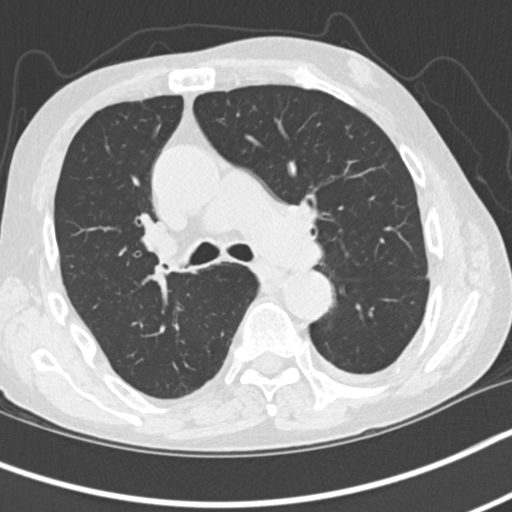
[im 96/153  lung]
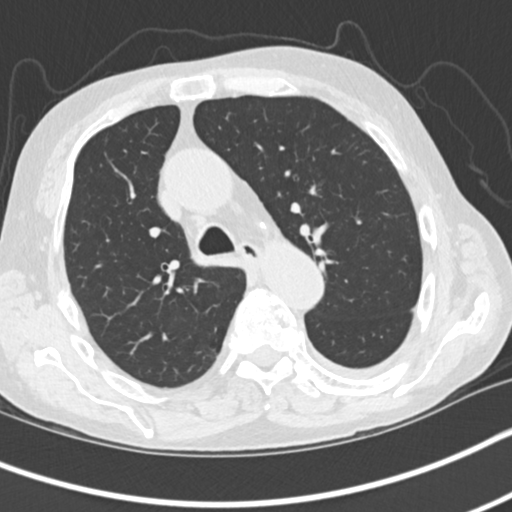
[im 107/153  mediastinal]
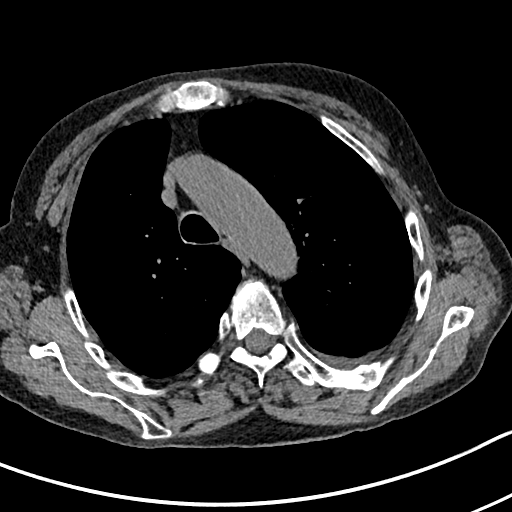
[im 107/153  lung]
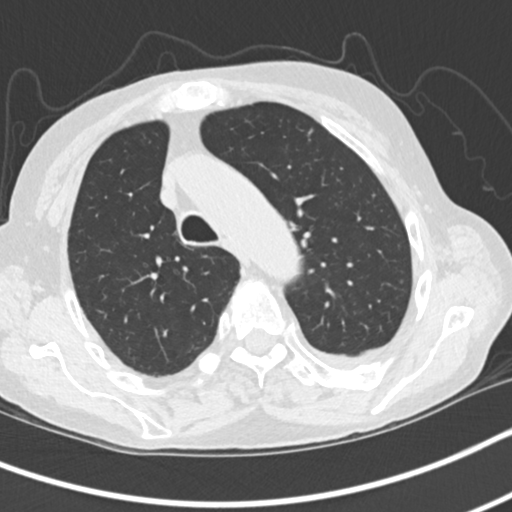
[im 119/153  lung]
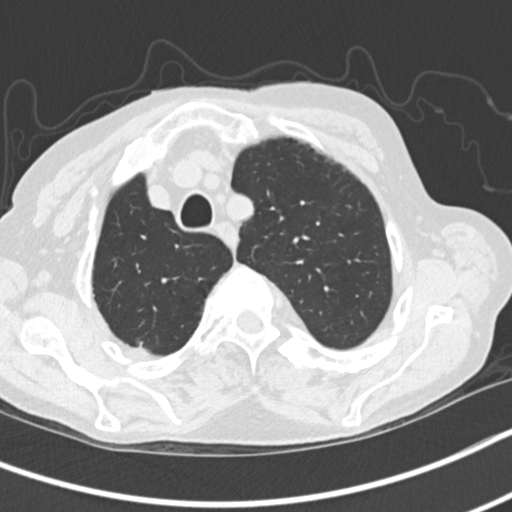
[im 130/153  lung]
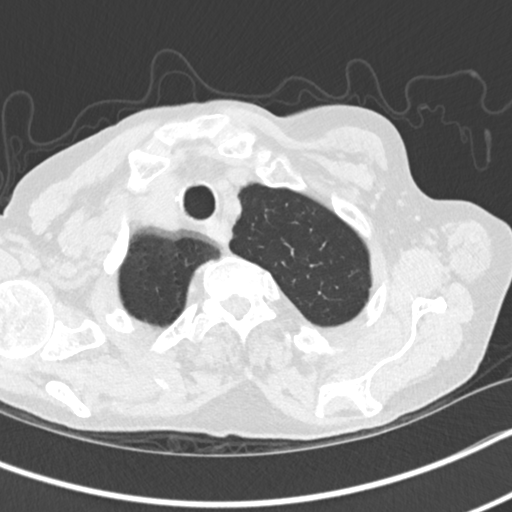
[im 141/153  lung]
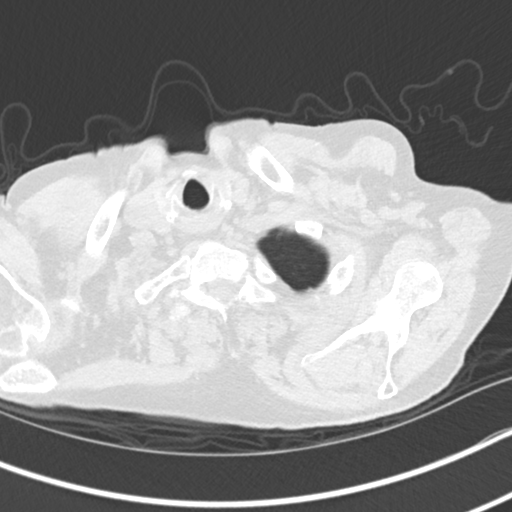

[Series 5: coronal · coronal · 0.60mm/px · 3 of 151 slices shown]
[im 31/151  lung]
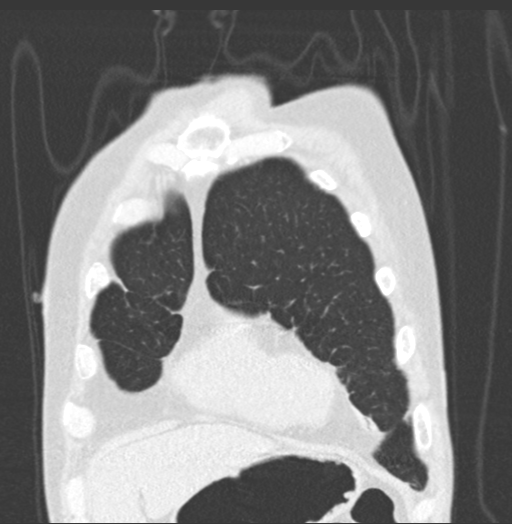
[im 61/151  lung]
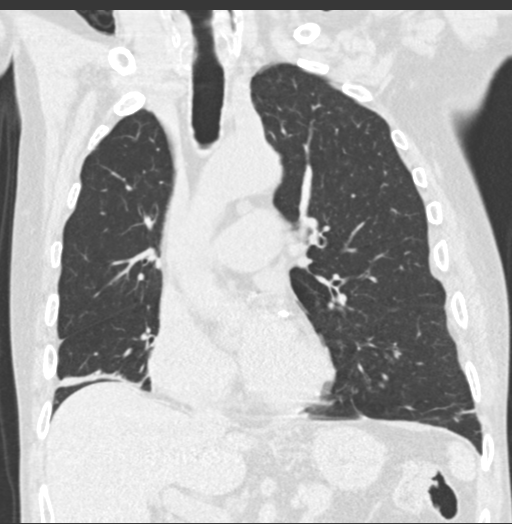
[im 91/151  lung]
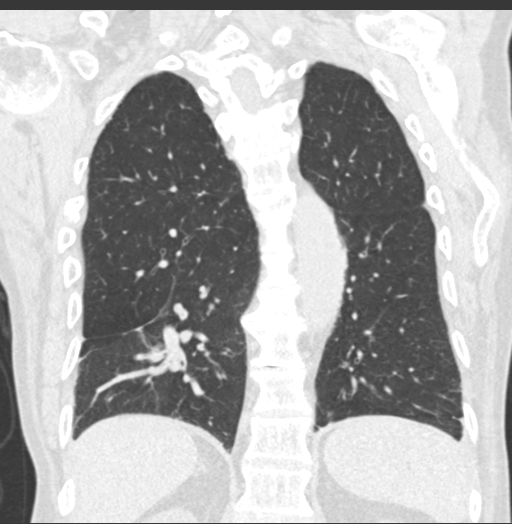

[15 of 36 positions shown; findings below may reference images not displayed]

FINDINGS: Cardiovascular: Heart is normal size. Tortuous thoracic aorta with
scattered calcifications. No evidence of aneurysm. Moderate to
severe coronary artery calcifications.

Mediastinum/Nodes: No mediastinal, hilar, or axillary adenopathy.

Lungs/Pleura: Trace bilateral pleural effusions. Linear scarring in
the lung bases. No pneumothorax.

Upper Abdomen: Imaging into the upper abdomen shows no acute
findings.

Musculoskeletal: Multiple old left rib fractures are noted in the
posterior 5th, 6th, 8th through 11th ribs. Acute fractures in the
lateral left 6th through 8th ribs as well as the anterior left 6th
rib.
IMPRESSION: Both acute and old left rib fractures are noted. The acute fractures
involve the 6th through 8th ribs on the left. No associated
pneumothorax.

Trace bilateral pleural effusions.

Bibasilar scarring.

Coronary artery disease.

Aortic Atherosclerosis ([id number]-[id number]).
